# Patient Record
Sex: Male | Born: 1985 | Race: Black or African American | Hispanic: No | Marital: Single | Smoking: Current some day smoker
Health system: Southern US, Community
[De-identification: ages and names within clinical notes are randomized; demographics above are authoritative.]

## PROBLEM LIST (undated history)

## (undated) ENCOUNTER — Emergency Department (HOSPITAL_COMMUNITY): Admission: EM | Payer: Self-pay | Source: Home / Self Care

## (undated) DIAGNOSIS — D573 Sickle-cell trait: Secondary | ICD-10-CM

## (undated) DIAGNOSIS — N4839 Other priapism: Secondary | ICD-10-CM

## (undated) HISTORY — PX: ORIF FINGER / THUMB FRACTURE: SUR932

---
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
## 2007-10-12 ENCOUNTER — Ambulatory Visit: Payer: Self-pay | Admitting: Internal Medicine

## 2007-10-12 DIAGNOSIS — R519 Headache, unspecified: Secondary | ICD-10-CM | POA: Insufficient documentation

## 2007-10-12 DIAGNOSIS — R51 Headache: Secondary | ICD-10-CM | POA: Insufficient documentation

## 2007-10-12 DIAGNOSIS — B353 Tinea pedis: Secondary | ICD-10-CM | POA: Insufficient documentation

## 2007-12-19 ENCOUNTER — Encounter: Payer: Self-pay | Admitting: Internal Medicine

## 2008-08-08 ENCOUNTER — Ambulatory Visit: Payer: Self-pay | Admitting: Internal Medicine

## 2008-08-08 DIAGNOSIS — M545 Low back pain, unspecified: Secondary | ICD-10-CM | POA: Insufficient documentation

## 2008-08-23 ENCOUNTER — Encounter: Payer: Self-pay | Admitting: Internal Medicine

## 2008-11-27 ENCOUNTER — Ambulatory Visit: Payer: Self-pay | Admitting: Internal Medicine

## 2008-11-27 ENCOUNTER — Encounter: Admission: RE | Admit: 2008-11-27 | Discharge: 2008-11-27 | Payer: Self-pay | Admitting: Internal Medicine

## 2009-03-14 ENCOUNTER — Telehealth: Payer: Self-pay | Admitting: Internal Medicine

## 2009-10-10 ENCOUNTER — Telehealth: Payer: Self-pay | Admitting: Internal Medicine

## 2009-10-21 ENCOUNTER — Ambulatory Visit: Payer: Self-pay | Admitting: Internal Medicine

## 2009-10-21 DIAGNOSIS — R109 Unspecified abdominal pain: Secondary | ICD-10-CM | POA: Insufficient documentation

## 2009-10-21 DIAGNOSIS — Z87891 Personal history of nicotine dependence: Secondary | ICD-10-CM | POA: Insufficient documentation

## 2009-11-07 ENCOUNTER — Telehealth: Payer: Self-pay | Admitting: Internal Medicine

## 2009-11-15 ENCOUNTER — Ambulatory Visit: Payer: Self-pay | Admitting: Internal Medicine

## 2010-10-21 NOTE — Assessment & Plan Note (Signed)
Summary: PER LORRAINE/MOM GROIN AREA PROBLEM STC   Vital Signs:  Patient profile:   25 year old male Height:      66 inches Weight:      179 pounds Temp:     97.7 degrees F oral Pulse rate:   78 / minute BP sitting:   104 / 72  (left arm)  Vitals Entered By: Lamar Sprinkles, CMA (November 15, 2009 3:53 PM) CC: F/U from last ov.    CC:  F/U from last ov. .  History of Present Illness: C/o L testicle pain x 6 wks; flared up again after he sneezed the other day  Current Medications (verified): 1)  Vitamin D 1000 Unit Tabs (Cholecalciferol) .Marland Kitchen.. 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 Lungs are clear to auscultation, no crackles or wheezes. Heart:  Normal rate and regular rhythm. S1 and S2 normal without gallop, murmur, click, rub or other extra sounds. Abdomen:  Bowel sounds positive,abdomen soft and non-tender without masses, organomegaly or hernias noted. Genitalia:  Testes bilaterally descended without nodularity, tenderness or masses. No scrotal masses or lesions. No penis lesions or urethral discharge. NTno hydrocele, no varicocele, and no cutaneous lesions.  L epididimus is a little sensitive to palpation Skin:  Intact without suspicious lesions or rashes   Impression & Recommendations:  Problem # 1:  GROIN PAIN (ICD-789.09) L  Assessment Unchanged Empiric Zpac The following medications were removed from the medication list:    Ibuprofen 600 Mg  Tabs (Ibuprofen) .Marland Kitchen... 1 by mouth three times a day x 7 d then prn  Orders: Urology Referral (Urology) Dr Annabell Howells  Complete Medication List: 1)  Vitamin D 1000 Unit Tabs (Cholecalciferol) .Marland Kitchen.. 1 by mouth qd  Patient Instructions: 1)  Call if you are not better in a reasonable amount of time or if worse.  2)  Take the Zpac

## 2010-10-21 NOTE — Progress Notes (Signed)
Summary: Rx request  Phone Note Call from Patient Call back at Home Phone [phone]   Caller: Patient Call For: Tresa Garter MD Reason for Call: Refill Medication Summary of Call: Patient requests a prescription of Ketoconazole 2% cream for his feet. [facility] [address] Initial call taken by: Irma Newness,  October 10, 2009 9:17 AM  Follow-up for Phone Call        OK Follow-up by: Tresa Garter MD,  October 10, 2009 12:46 PM    New/Updated Medications: KETOCONAZOLE 2 % CREA (KETOCONAZOLE) use bid Prescriptions: KETOCONAZOLE 2 % CREA (KETOCONAZOLE) use bid  #90 g x 3   Entered by:   Josph Macho CMA   Authorized by:   Tresa Garter MD   Signed by:   Josph Macho CMA on 10/10/2009   Method used:   [facility] [address] [id number][facility] (retail)       [address]       [place], [place]  [address]       Ph: [phone] or [phone]       Fax: [phone]   RxID:   [id number]   Appended Document: Rx request Tried to call pt and VM is full  Appended Document: Rx request Pt informed

## 2010-10-21 NOTE — Assessment & Plan Note (Signed)
Summary: GROIN PAIN /NWS   Vital Signs:  Patient profile:   25 year old male Height:      67 inches Weight:      188 pounds BMI:     29.55 Temp:     98.6 degrees F oral Pulse rate:   61 / minute BP sitting:   112 / 80  (left arm)  Vitals Entered By: Tora Perches (October 21, 2009 4:04 PM) CC: groin pain Is Patient Diabetic? No   CC:  groin pain.  History of Present Illness: C/o dull pain in L groin  in L testicle  x 1.5 wks off and on. No injury worse w/activity; no swelling, pulling up, no hernia. Denies STD, d/c etc  Preventive Screening-Counseling & Management  Alcohol-Tobacco     Smoking Status: never  Caffeine-Diet-Exercise     Does Patient Exercise: yes  Current Medications (verified): 1)  Hydrocodone-Acetaminophen 5-325 Mg Tabs (Hydrocodone-Acetaminophen) .Marland Kitchen.. 1 By Mouth Up To 4 Times Per Day As Needed For Pain 2)  Ibuprofen 600 Mg  Tabs (Ibuprofen) .Marland Kitchen.. 1 By Mouth Three Times A Day X 4 D Then Prn 3)  Ketoconazole 2 % Crea (Ketoconazole) .... Use Bid  Allergies (verified): No Known Drug Allergies  Past History:  Past Medical History: Last updated: 08/08/2008  Headache  Family History: Reviewed history from 10/12/2007 and no changes required. 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    Physical Exam  General:  NAD Mouth:  Oral mucosa and oropharynx without lesions or exudates.  Teeth in good repair. Lungs:  Normal respiratory effort, chest expands symmetrically. Lungs are clear to auscultation, no crackles or wheezes. Heart:  Normal rate and regular rhythm. S1 and S2 normal without gallop, murmur, click, rub or other extra sounds. Abdomen:   Bowel sounds positive,abdomen soft and non-tender without masses, organomegaly or hernias noted. Genitalia:  Testes bilaterally descended without nodularity, tenderness or masses. No scrotal masses or lesions. No penis lesions or urethral discharge. NTno hydrocele, no varicocele, and no cutaneous lesions.  L epididimus is a little sensitive to palpation Msk:  No deformity or scoliosis noted of thoracic or lumbar spine.   Neurologic:  No cranial nerve deficits noted. Station and gait are normal. Plantar reflexes are down-going bilaterally. DTRs are symmetrical throughout. Sensory, motor and coordinative functions appear intact. Skin:  Intact without suspicious lesions or rashes Psych:  Cognition and judgment appear intact. Alert and cooperative with normal attention span and concentration. No apparent delusions, illusions, hallucinations   Impression & Recommendations:  Problem # 1:  GROIN PAIN (ICD-789.09) L - unclear etiol. Possible epididimitis Assessment New Empiric Cipro. Labs. [place] or Urol consult if needed The following medications were removed from the medication list:    Hydrocodone-acetaminophen 5-325 Mg Tabs (Hydrocodone-acetaminophen) .Marland Kitchen... 1 by mouth up to 4 times per day as needed for pain His updated medication list for this problem includes:    Ibuprofen 600 Mg Tabs (Ibuprofen) .Marland Kitchen... 1 by mouth three times a day x 7 d then prn  Complete Medication List: 1)  Ibuprofen 600 Mg Tabs (Ibuprofen) .Marland Kitchen.. 1 by mouth three times a day x 7 d then prn 2)  Ciprofloxacin Hcl 500 Mg Tabs (Ciprofloxacin hcl) .Marland Kitchen.. 1 by mouth bid 3)  Vitamin D 1000 Unit Tabs (Cholecalciferol) .Marland Kitchen.. 1 by mouth qd  Patient Instructions: 1)  Call if you are not better in a reasonable amount of time or if worse. Go to ER if feeling really bad! 2)  Use athletic underwear Prescriptions: CIPROFLOXACIN HCL 500 MG TABS (CIPROFLOXACIN HCL) 1 by mouth bid  #20 x 0   Entered and Authorized by:   Tresa Garter MD    Signed by:   Tresa Garter MD on 10/21/2009   Method used:   Print then Give to Patient   RxID:   [id number] IBUPROFEN 600 MG  TABS (IBUPROFEN) 1 by mouth three times a day x 7 d then prn  #60 x 1   Entered and Authorized by:   Tresa Garter MD   Signed by:   Tresa Garter MD on 10/21/2009   Method used:   Print then Give to Patient   RxID:   [id number]

## 2010-10-21 NOTE — Progress Notes (Signed)
Summary: REQ FOR RX  Phone Note Call from Patient   Summary of Call: Pt's mother, Karin Golden, called. Pt c/o "cold" and has "no voice", Req rx for antibiotic. left mess to call office back with more details Initial call taken by: Lamar Sprinkles, CMA,  November 07, 2009 9:23 AM  Follow-up for Phone Call        Spoke with pt's mother. Pt c/o chills, sinus & chest congestion w/green mucus. No fever. OTC meds have not helped. Patient is requesting rx for antibiotic.  Follow-up by: Lamar Sprinkles, CMA,  November 07, 2009 4:24 PM  Additional Follow-up for Phone Call Additional follow up Details #1::        OK Zpac Additional Follow-up by: Tresa Garter MD,  November 08, 2009 7:24 AM    Additional Follow-up for Phone Call Additional follow up Details #2::    Pt informed  Follow-up by: Lamar Sprinkles, CMA,  November 08, 2009 12:08 PM  New/Updated Medications: ZITHROMAX Z-PAK 250 MG TABS (AZITHROMYCIN) as dirrected Prescriptions: ZITHROMAX Z-PAK 250 MG TABS (AZITHROMYCIN) as dirrected  #1 x 0   Entered by:   Lamar Sprinkles, CMA   Authorized by:   Tresa Garter MD   Signed by:   Lamar Sprinkles, CMA on 11/08/2009   Method used:   [facility]        [facility] [address] [id number][facility] ([facility]ail[facility]       [address]       [place], [place]  [address]       Ph: [phone] or [phone]       Fax: [phone]   RxID:   [id number]

## 2012-02-23 ENCOUNTER — Ambulatory Visit (INDEPENDENT_AMBULATORY_CARE_PROVIDER_SITE_OTHER): Payer: Self-pay | Admitting: Internal Medicine

## 2012-02-23 ENCOUNTER — Encounter: Payer: Self-pay | Admitting: Internal Medicine

## 2012-02-23 ENCOUNTER — Other Ambulatory Visit (INDEPENDENT_AMBULATORY_CARE_PROVIDER_SITE_OTHER): Payer: Self-pay

## 2012-02-23 VITALS — BP 110/70 | HR 76 | Temp 97.7°F | Resp 20 | Wt 173.0 lb

## 2012-02-23 DIAGNOSIS — R5383 Other fatigue: Secondary | ICD-10-CM | POA: Insufficient documentation

## 2012-02-23 DIAGNOSIS — I889 Nonspecific lymphadenitis, unspecified: Secondary | ICD-10-CM

## 2012-02-23 DIAGNOSIS — J029 Acute pharyngitis, unspecified: Secondary | ICD-10-CM

## 2012-02-23 DIAGNOSIS — R5381 Other malaise: Secondary | ICD-10-CM

## 2012-02-23 LAB — BASIC METABOLIC PANEL
BUN: 11 mg/dL (ref 6–23)
CO2: 28 mEq/L (ref 19–32)
Chloride: 105 mEq/L (ref 96–112)
Creatinine, Ser: 1.1 mg/dL (ref 0.4–1.5)
Potassium: 4.3 mEq/L (ref 3.5–5.1)

## 2012-02-23 LAB — CBC WITH DIFFERENTIAL/PLATELET
Basophils Absolute: 0 10*3/uL (ref 0.0–0.1)
Eosinophils Absolute: 0.6 10*3/uL (ref 0.0–0.7)
Lymphocytes Relative: 55.1 % — ABNORMAL HIGH (ref 12.0–46.0)
MCHC: 33 g/dL (ref 30.0–36.0)
Neutrophils Relative %: 17.9 % — ABNORMAL LOW (ref 43.0–77.0)
Platelets: 221 10*3/uL (ref 150.0–400.0)
RDW: 12.9 % (ref 11.5–14.6)

## 2012-02-23 LAB — HEPATIC FUNCTION PANEL
Alkaline Phosphatase: 87 U/L (ref 39–117)
Bilirubin, Direct: 0.2 mg/dL (ref 0.0–0.3)

## 2012-02-23 MED ORDER — IBUPROFEN 600 MG PO TABS: ORAL_TABLET | ORAL | Status: AC

## 2012-02-23 MED ORDER — AZITHROMYCIN 250 MG PO TABS: ORAL_TABLET | ORAL | Status: DC

## 2012-02-23 NOTE — Progress Notes (Signed)
  Subjective:    Patient ID: Gabriel Thomas, male    DOB: April 15, 1986, 26 y.o.   MRN: [id number]  HPI  C/o URI 1 mo ago C/o feeling tired, c/o swelling of the glands on the neck. No wt loss  Review of Systems  Constitutional: Positive for fatigue. Negative for fever, chills, diaphoresis, appetite change and unexpected weight change.  HENT: Negative for nosebleeds, congestion, sore throat, rhinorrhea, sneezing, mouth sores, trouble swallowing, neck pain, neck stiffness, dental problem and postnasal drip.   Eyes: Negative for discharge, redness, itching and visual disturbance.  Respiratory: Negative for cough, chest tightness, shortness of breath and wheezing.   Cardiovascular: Negative for chest pain, palpitations and leg swelling.  Gastrointestinal: Negative for nausea, diarrhea, blood in stool and abdominal distention.  Genitourinary: Negative for dysuria, frequency, hematuria and genital sores.  Musculoskeletal: Negative for back pain, joint swelling and gait problem.  Skin: Negative for rash.  Neurological: Negative for dizziness, tremors, speech difficulty and weakness.  Psychiatric/Behavioral: Negative for suicidal ideas, sleep disturbance, dysphoric mood and agitation. The patient is not nervous/anxious.        Objective:   Physical Exam  Constitutional: He is oriented to person, place, and time. He appears well-developed.  HENT:  Mouth/Throat: Oropharynx is clear and moist.  Eyes: Conjunctivae are normal. Pupils are equal, round, and reactive to light.  Neck: Normal range of motion. No JVD present. No thyromegaly present.       Anter/post cerv and occipital adenopathy, NT  Cardiovascular: Normal rate, regular rhythm, normal heart sounds and intact distal pulses.  Exam reveals no gallop and no friction rub.   No murmur heard. Pulmonary/Chest: Effort normal and breath sounds normal. No respiratory distress. He has no wheezes. He has no rales. He exhibits no tenderness.  Abdominal:  Soft. Bowel sounds are normal. He exhibits no distension and no mass. There is no tenderness. There is no rebound and no guarding.       No HSM  Musculoskeletal: Normal range of motion. He exhibits no edema and no tenderness.  Lymphadenopathy:    He has cervical adenopathy.  Neurological: He is alert and oriented to person, place, and time. He has normal reflexes. No cranial nerve deficit. He exhibits normal muscle tone. Coordination normal.  Skin: Skin is warm and dry. No rash noted.  Psychiatric: He has a normal mood and affect. His behavior is normal. Judgment and thought content normal.    Lab Results  Component Value Date   WBC 5.5 02/23/2012   HGB 14.1 02/23/2012   HCT 42.8 02/23/2012   PLT 221.0 02/23/2012   GLUCOSE 87 02/23/2012   ALT 27 02/23/2012   AST 26 02/23/2012   NA 142 02/23/2012   K 4.3 02/23/2012   CL 105 02/23/2012   CREATININE 1.1 02/23/2012   BUN 11 02/23/2012   CO2 28 02/23/2012         Assessment & Plan:

## 2012-02-24 ENCOUNTER — Telehealth: Payer: Self-pay | Admitting: Internal Medicine

## 2012-02-24 ENCOUNTER — Ambulatory Visit (INDEPENDENT_AMBULATORY_CARE_PROVIDER_SITE_OTHER)
Admission: RE | Admit: 2012-02-24 | Discharge: 2012-02-24 | Disposition: A | Discharge status: Home / Self Care | Payer: Self-pay | Source: Ambulatory Visit | Attending: Internal Medicine | Admitting: Internal Medicine

## 2012-02-24 DIAGNOSIS — I889 Nonspecific lymphadenitis, unspecified: Secondary | ICD-10-CM

## 2012-02-24 DIAGNOSIS — R5381 Other malaise: Secondary | ICD-10-CM

## 2012-02-24 LAB — HIV ANTIBODY (ROUTINE TESTING W REFLEX): HIV: NONREACTIVE

## 2012-02-24 NOTE — Telephone Encounter (Signed)
Pt informed

## 2012-02-24 NOTE — Assessment & Plan Note (Signed)
Zpac 

## 2012-02-24 NOTE — Assessment & Plan Note (Addendum)
Labs, CXR Zpac, Ibuprofen Call if not better. Consider Prednisone, LN bx RTC 4 wks

## 2012-02-24 NOTE — Telephone Encounter (Signed)
Gabriel Thomas, please, inform patient that all labs are normal - blood count is c/w a viral infection. Rx as we planned. Thx

## 2013-06-19 ENCOUNTER — Ambulatory Visit (INDEPENDENT_AMBULATORY_CARE_PROVIDER_SITE_OTHER): Payer: Self-pay | Admitting: *Deleted

## 2013-06-19 DIAGNOSIS — Z23 Encounter for immunization: Secondary | ICD-10-CM

## 2013-06-26 ENCOUNTER — Encounter (HOSPITAL_BASED_OUTPATIENT_CLINIC_OR_DEPARTMENT_OTHER): Payer: Self-pay

## 2013-06-26 ENCOUNTER — Emergency Department (HOSPITAL_BASED_OUTPATIENT_CLINIC_OR_DEPARTMENT_OTHER)
Admission: EM | Admit: 2013-06-26 | Discharge: 2013-06-26 | Disposition: A | Discharge status: Home / Self Care | Payer: Self-pay | Attending: Emergency Medicine | Admitting: Emergency Medicine

## 2013-06-26 DIAGNOSIS — Z87891 Personal history of nicotine dependence: Secondary | ICD-10-CM | POA: Insufficient documentation

## 2013-06-26 DIAGNOSIS — Y9389 Activity, other specified: Secondary | ICD-10-CM | POA: Insufficient documentation

## 2013-06-26 DIAGNOSIS — S61211A Laceration without foreign body of left index finger without damage to nail, initial encounter: Secondary | ICD-10-CM

## 2013-06-26 DIAGNOSIS — W268XXA Contact with other sharp object(s), not elsewhere classified, initial encounter: Secondary | ICD-10-CM | POA: Insufficient documentation

## 2013-06-26 DIAGNOSIS — S61209A Unspecified open wound of unspecified finger without damage to nail, initial encounter: Secondary | ICD-10-CM | POA: Insufficient documentation

## 2013-06-26 DIAGNOSIS — Z23 Encounter for immunization: Secondary | ICD-10-CM | POA: Insufficient documentation

## 2013-06-26 DIAGNOSIS — Y929 Unspecified place or not applicable: Secondary | ICD-10-CM | POA: Insufficient documentation

## 2013-06-26 MED ORDER — TETANUS-DIPHTH-ACELL PERTUSSIS 5-2.5-18.5 LF-MCG/0.5 IM SUSP
0.5000 mL | Freq: Once | INTRAMUSCULAR | Status: AC
  Administered 2013-06-26: 0.5 mL via INTRAMUSCULAR
  Filled 2013-06-26: qty 0.5

## 2013-06-26 NOTE — ED Notes (Signed)
Pt was trimming his hair and accidentally cut his second digit finger on left hand.  Approximately half inch, bleeding controlled. Pt put super glue over the cut in attempt to stop the bleeding.

## 2013-06-26 NOTE — ED Provider Notes (Signed)
CSN: [id number]     Arrival date & time 06/26/13  1649 History  This chart was scribed for Charles B. Bernette Mayers, MD by Greggory Stallion, ED Scribe. This patient was seen in room MH02/MH02 and the patient's care was started at 4:55 PM.   No chief complaint on file.  The history is provided by the patient. No language interpreter was used.    HPI Comments: Gabriel Thomas is a 27 y.o. male who presents to the Emergency Department complaining of left index finger injury that occurred a few hours ago. He states he was trimming his hair and accidentally cut his finger. Pt has sudden onset, mild finger pain. He states he put superglue over the cut in attempt to stop the bleeding and put gauze over it. Pt states his last tetanus was over 5 years ago.  History reviewed. No pertinent past medical history. History reviewed. No pertinent past surgical history. Family History  Problem Relation Age of Onset  . Arthritis Mother   . Diabetes Mother   . Hypertension Mother    History  Substance Use Topics  . Smoking status: Former Games developer  . Smokeless tobacco: Not on file  . Alcohol Use: No    Review of Systems A complete 10 system review of systems was obtained and all systems are negative except as noted in the HPI and PMH.   Allergies  Review of patient's allergies indicates no known allergies.  Home Medications   Current Outpatient Rx  Name  Route  Sig  Dispense  Refill  . EXPIRED: azithromycin (ZITHROMAX Z-PAK) 250 MG tablet      As dirrected   6 each   0    BP 118/68  Pulse 61  Temp(Src) 98.4 F (36.9 C) (Oral)  Resp 18  SpO2 99%  Physical Exam  Constitutional: He is oriented to person, place, and time. He appears well-developed and well-nourished.  HENT:  Head: Normocephalic and atraumatic.  Neck: Neck supple.  Pulmonary/Chest: Effort normal.  Musculoskeletal:  1.5 cm laceration to the left index finger over the radial PIP joint dressed with superglue and gauze at home.    Neurological: He is alert and oriented to person, place, and time. No cranial nerve deficit.  Psychiatric: He has a normal mood and affect. His behavior is normal.    ED Course  Procedures (including critical care time)  DIAGNOSTIC STUDIES: Oxygen Saturation is 99% on RA, normal by my interpretation.    COORDINATION OF CARE: 4:59 PM-Discussed treatment plan which includes updating tetanus and getting glue off the wound and cleaning it with pt at bedside and pt agreed to plan.   Labs Review Labs Reviewed - No data to display Imaging Review No results found.  MDM   1. Laceration of left index finger w/o foreign body w/o damage to nail, initial encounter     Pt concerned because he accidentally got gauze stuck in the superglue he used to dress his wound at home. A small amount of bacitracin was applied to loosed the glue and the gauze was removed. Wound is closed and intact at this point will not attempt to fully remove the glue. PT states wound was completely closed before he applied the gauze. Pt comfortable with this plan, informed of possibility of retained FB and he understands this is at risk of infection. Advised to return immediately for any swelling, redness, pain or drainage. TDAP updated.      I personally performed the services described in this documentation, which  was scribed in my presence. The recorded information has been reviewed and is accurate.     Charles B. Bernette Mayers, MD 06/26/13 1806

## 2013-09-18 ENCOUNTER — Encounter: Payer: Self-pay | Admitting: *Deleted

## 2013-10-30 ENCOUNTER — Other Ambulatory Visit: Payer: Self-pay | Admitting: *Deleted

## 2013-10-30 NOTE — Telephone Encounter (Signed)
Ok Ketoconazole cream Thx

## 2013-10-30 NOTE — Telephone Encounter (Signed)
Per mother- pt is requesting fungal foot cream. Please advise.

## 2013-10-31 ENCOUNTER — Telehealth: Payer: Self-pay | Admitting: *Deleted

## 2013-10-31 MED ORDER — KETOCONAZOLE 2 % EX CREA: 1.0000 "application " | TOPICAL_CREAM | Freq: Two times a day (BID) | CUTANEOUS | Status: DC

## 2013-10-31 NOTE — Telephone Encounter (Signed)
Left detailed mess informing pt I need to know what pharmacy he uses so I can send ketoconazole cream Rx there. I have printed Rx on my desk.

## 2013-11-03 NOTE — Telephone Encounter (Signed)
Unable to contact pt- [facility] [place] [place]

## 2013-12-07 IMAGING — CR DG CHEST 2V
2 series · 2 of 2 positions shown · non-contrast
Comparison: None

CLINICAL DATA: Cervical adenopathy/lymphadenitis for 1 month

CHEST - 2 VIEW

[view not recorded (1 of 2)]
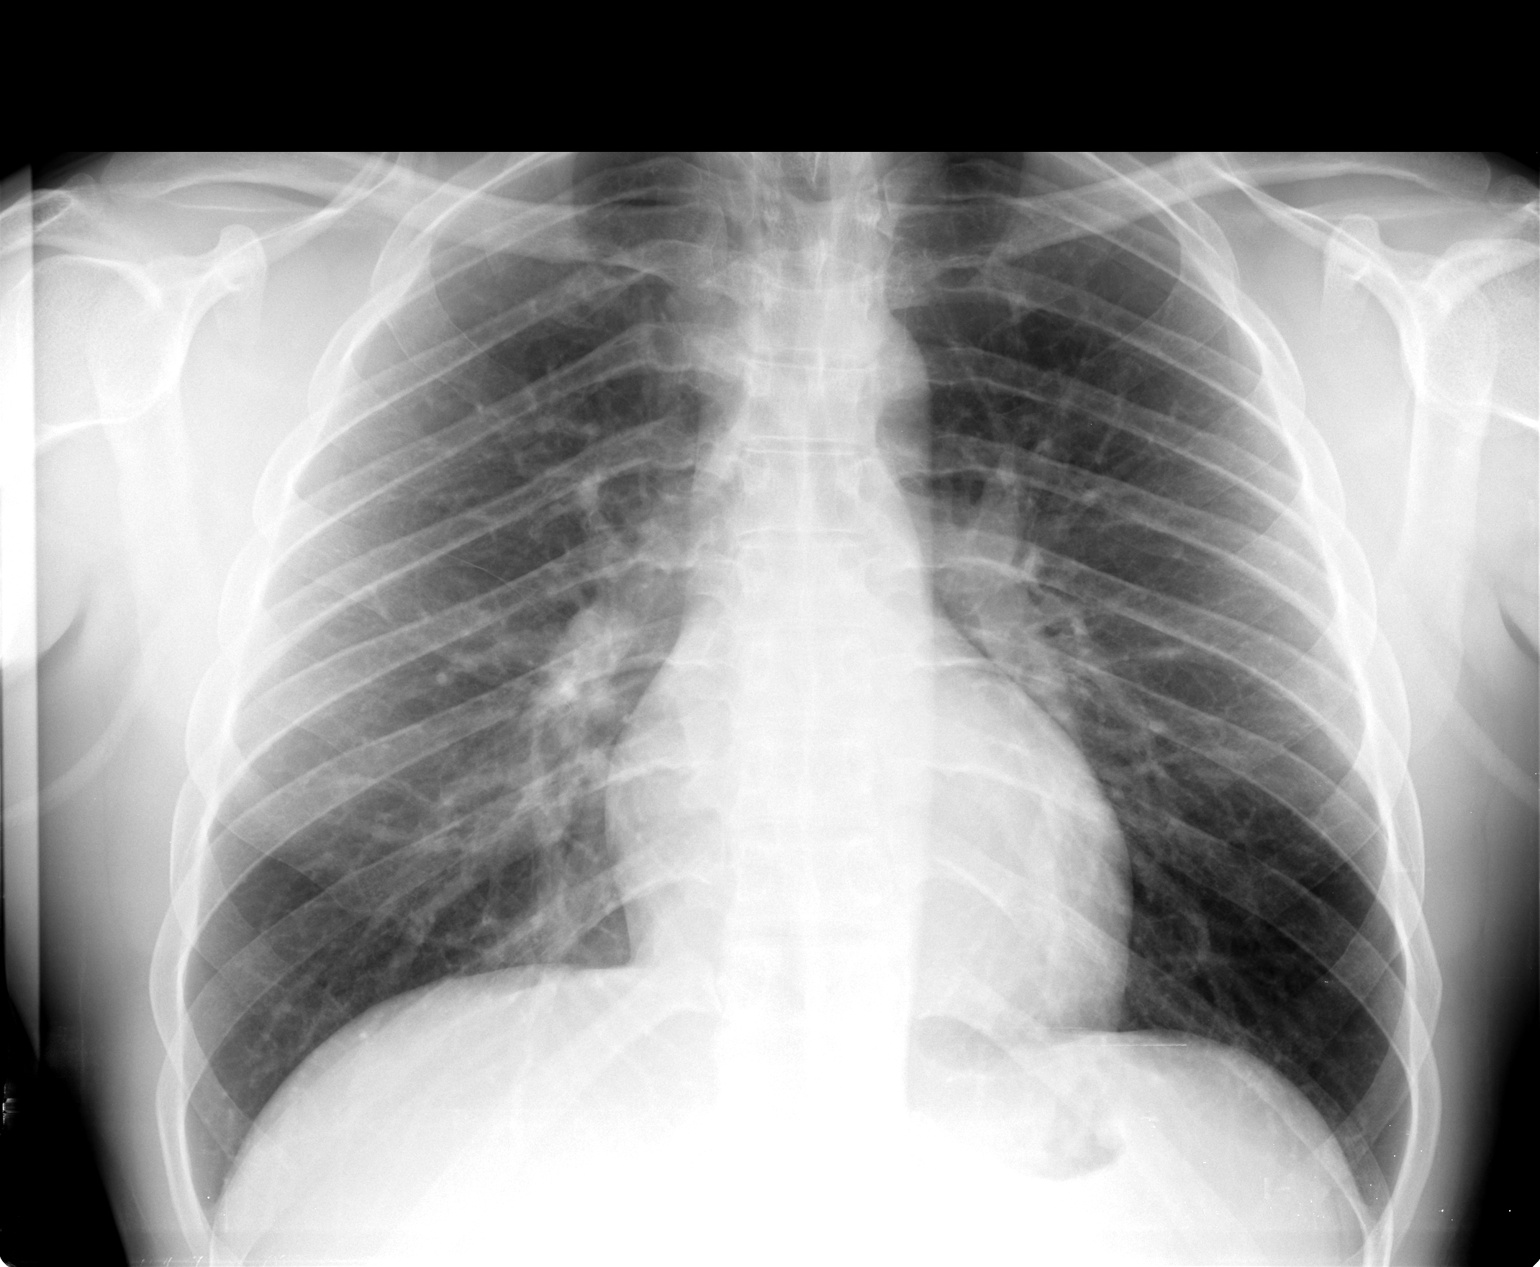

[view not recorded (2 of 2)]
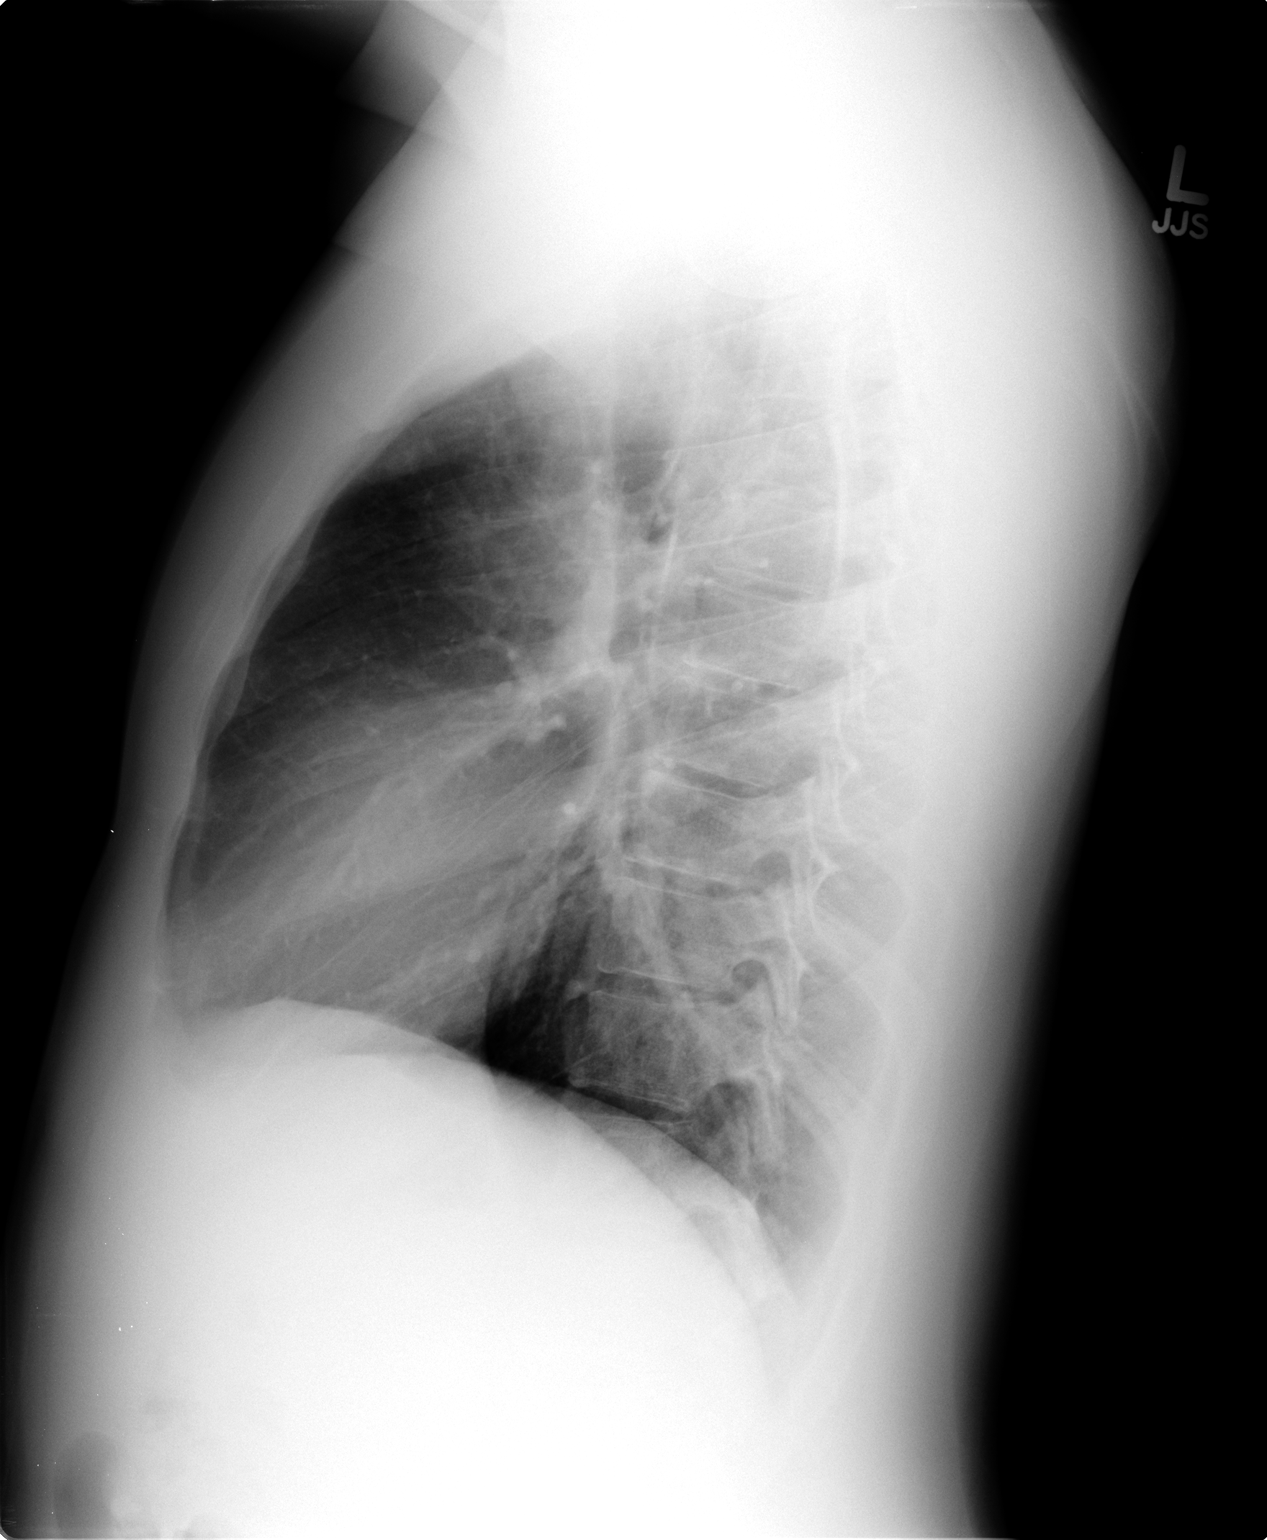

[2 of 2 positions shown; findings below may reference images not displayed]

FINDINGS: Normal heart size, mediastinal contours, and pulmonary vascularity.
Lungs clear.
No pleural effusion or pneumothorax.
Bones unremarkable.
IMPRESSION: Normal exam.

## 2014-06-09 ENCOUNTER — Encounter (HOSPITAL_BASED_OUTPATIENT_CLINIC_OR_DEPARTMENT_OTHER): Payer: Self-pay | Admitting: Emergency Medicine

## 2014-06-09 ENCOUNTER — Emergency Department (HOSPITAL_BASED_OUTPATIENT_CLINIC_OR_DEPARTMENT_OTHER)
Admission: EM | Admit: 2014-06-09 | Discharge: 2014-06-09 | Disposition: A | Discharge status: Home / Self Care | Payer: Self-pay | Attending: Emergency Medicine | Admitting: Emergency Medicine

## 2014-06-09 DIAGNOSIS — N4830 Priapism, unspecified: Secondary | ICD-10-CM | POA: Insufficient documentation

## 2014-06-09 DIAGNOSIS — R300 Dysuria: Secondary | ICD-10-CM | POA: Insufficient documentation

## 2014-06-09 DIAGNOSIS — Z87440 Personal history of urinary (tract) infections: Secondary | ICD-10-CM | POA: Insufficient documentation

## 2014-06-09 DIAGNOSIS — Z79899 Other long term (current) drug therapy: Secondary | ICD-10-CM | POA: Insufficient documentation

## 2014-06-09 LAB — URINALYSIS, ROUTINE W REFLEX MICROSCOPIC
BILIRUBIN URINE: NEGATIVE
Glucose, UA: NEGATIVE mg/dL
Hgb urine dipstick: NEGATIVE
Ketones, ur: NEGATIVE mg/dL
LEUKOCYTES UA: NEGATIVE
NITRITE: NEGATIVE
PH: 7 (ref 5.0–8.0)
Protein, ur: NEGATIVE mg/dL
Specific Gravity, Urine: 1.017 (ref 1.005–1.030)
UROBILINOGEN UA: 0.2 mg/dL (ref 0.0–1.0)

## 2014-06-09 MED ORDER — SODIUM CHLORIDE 0.9 % IV BOLUS (SEPSIS)
1000.0000 mL | Freq: Once | INTRAVENOUS | Status: AC
  Administered 2014-06-09: 1000 mL via INTRAVENOUS

## 2014-06-09 MED ORDER — HYDROMORPHONE HCL 1 MG/ML IJ SOLN
2.0000 mg | Freq: Once | INTRAMUSCULAR | Status: AC
  Administered 2014-06-09: 2 mg via INTRAVENOUS
  Filled 2014-06-09: qty 2

## 2014-06-09 MED ORDER — TERBUTALINE SULFATE 1 MG/ML IJ SOLN
0.2500 mg | Freq: Once | INTRAMUSCULAR | Status: DC
  Filled 2014-06-09: qty 1

## 2014-06-09 MED ORDER — HYDROMORPHONE HCL 1 MG/ML IJ SOLN
1.0000 mg | Freq: Once | INTRAMUSCULAR | Status: AC
  Administered 2014-06-09: 1 mg via INTRAVENOUS
  Filled 2014-06-09: qty 1

## 2014-06-09 MED ORDER — PSEUDOEPHEDRINE HCL 30 MG PO TABS
120.0000 mg | ORAL_TABLET | Freq: Once | ORAL | Status: AC
Start: 2014-06-09 — End: 2014-06-09
  Administered 2014-06-09: 120 mg via ORAL
  Filled 2014-06-09: qty 4

## 2014-06-09 NOTE — ED Notes (Signed)
PT called mother to  drive him home when he is ready for discharge

## 2014-06-09 NOTE — ED Notes (Signed)
Pt was diagnosed with UTI and completed antibiotics at the end of august.  Pt woke up this am not feeling well with pain in tip of penis.  No fever or chills.  No pain or burning with urination.  No flank pain, no discharge from penis.

## 2014-06-09 NOTE — Discharge Instructions (Signed)
Priapism °Priapism is a persistent, often painful erection. It is the hardening of the penis in males and of the clitoris in females, even without sexual stimulation. Priapism may come on suddenly. Priapism may last a short while, or may last a long time. Priapism occurs in all ages. The types of priapism include: °· Acute prolonged priapism--Priapism that comes on suddenly and lasts. °· Recurrent acute priapism--Priapism that comes on suddenly and tends to happen again. °· Chronic priapism--Priapism that is persistent but with less of an erection. °CAUSES  °There are many causes. Causes include: °· Blood problems common in people with the following diseases: °¨ Sickle cell disease. °¨ Leukemia. °· Side effects of erectile dysfunction medicine. This is the most common cause of priapism. °· Side effects of prescription medicine used in the treatment of depression and anxiety. °· Illegal use of street drugs such as cocaine and marijuana. °· Excessive use of alcohol. °· Neurological problems such as multiple sclerosis. °· Diabetes mellitus. °· The cause may be unknown. °SIGNS AND SYMPTOMS °· A prolonged erection, usually without sexual stimulation or following the use of erectile dysfunction medicine. °· A painful erection. °DIAGNOSIS °Diagnosis of priapism can usually be confirmed by your health care provider after a physical exam. Your health care provider may have blood tests done to search for a potential cause, such as leukemia or sickle cell disease. °TREATMENT  °Treatments depend on the cause. Some specific treatments include: °· Oxygen and red blood cell transfusions in patients with sickle cell disease. °· A special treatment for plasma in those with leukemia. °· Removing blood that is trapped. °· Treatment with medicine. °· Surgical shunting (a passage that is made to allow blood to flow from one part of the body to another). °HOME CARE INFORMATION °· Avoid sexual stimulation and intercourse until your health  care provider says it is okay. °· Avoid the use of alcohol or drugs to minimize recurrence of priapism. °SEEK MEDICAL CARE IF: °· You experience worsening pain instead of improvement. °SEEK IMMEDIATE MEDICAL CARE IF: °· You experience fever or shaking chills. °· You experience pain, swelling, or redness in your genital or groin area. °MAKE SURE YOU: °· Understand these instructions.   °· Will watch your condition. °· Will get help right away if you are not doing well or get worse. °Document Released: 11/28/2003 Document Revised: 06/28/2013 Document Reviewed: 02/09/2013 °[employer]® Patient Information ©2015 [employer]. This information is not intended to replace advice given to you by your health care provider. Make sure you discuss any questions you have with your health care provider. ° °

## 2014-06-09 NOTE — ED Notes (Signed)
Ice pack applied to groin area

## 2014-06-09 NOTE — ED Provider Notes (Signed)
Medical screening examination/treatment/procedure(s) were conducted as a shared visit with non-physician practitioner(s) and myself.  I personally evaluated the patient during the encounter.   EKG Interpretation None      Pt presenting w/ priapism. No clear cause. No scrotal pain, swelling. No infectious s/sx recently. Penis erect, painful. He is hesitant to try intracorporal injection. Will do trial of IVF, IV pain meds. And reexamine.   Toy Cookey, MD 06/09/14 [phone]

## 2014-06-09 NOTE — ED Notes (Signed)
PT discharged to home with mother. NAD.  

## 2014-06-09 NOTE — ED Provider Notes (Signed)
CSN: [id number]     Arrival date & time 06/09/14  1342 History   First MD Initiated Contact with Patient 06/09/14 1449     Chief Complaint  Patient presents with  . Urinary Tract Infection     (Consider location/radiation/quality/duration/timing/severity/associated sxs/prior Treatment) Patient is a 28 y.o. male presenting with dysuria. The history is provided by the patient. No language interpreter was used.  Dysuria This is a new problem. The current episode started yesterday. Pertinent negatives include no abdominal pain, chills, fever, myalgias, nausea, rash or vomiting. Associated symptoms comments: He is here for evaluation of urethral pain he states feels like it is in the distal end of the penis. No urinary pain, frequency, hematuria. No fever. He was treated for UTI several weeks ago and completed the antibiotics that were given. He denies having UTI symptoms at that time and that it was an incidental finding. He reports that he woke this morning with an erection that has persisted all day today. He has no history of same. No abdominal pain, penile discharge. He is able to urinate today without obstructive symptoms. .    History reviewed. No pertinent past medical history. Past Surgical History  Procedure Laterality Date  . Orif finger / thumb fracture     Family History  Problem Relation Age of Onset  . Arthritis Mother   . Diabetes Mother   . Hypertension Mother    History  Substance Use Topics  . Smoking status: Former Games developer  . Smokeless tobacco: Not on file  . Alcohol Use: Yes    Review of Systems  Constitutional: Negative for fever and chills.  Gastrointestinal: Negative.  Negative for nausea, vomiting and abdominal pain.  Genitourinary: Positive for penile pain. Negative for dysuria, frequency, flank pain, discharge, scrotal swelling and testicular pain.       See HPI.  Musculoskeletal: Negative.  Negative for back pain and myalgias.  Skin: Negative.  Negative  for rash and wound.  Neurological: Negative.       Allergies  Review of patient's allergies indicates no known allergies.  Home Medications   Prior to Admission medications   Medication Sig Start Date End Date Taking? Authorizing Provider  azithromycin (ZITHROMAX Z-PAK) 250 MG tablet As dirrected 02/23/12 02/28/12  Aleksei Plotnikov V, MD  ketoconazole (NIZORAL) 2 % cream Apply 1 application topically 2 (two) times daily. 10/30/13   Aleksei Plotnikov V, MD   BP 127/66  Pulse 60  Temp(Src) 98.1 F (36.7 C)  Resp 16  Ht  (1.702 m)  Wt 175 lb (79.379 kg)  BMI 27.40 kg/m2  SpO2 100% Physical Exam  Constitutional: He is oriented to person, place, and time. He appears well-developed and well-nourished.  Neck: Normal range of motion.  Pulmonary/Chest: Effort normal.  Genitourinary:  Circumcised penis with erection. No urethral discharge. No scrotal swelling or testicular tenderness.   Musculoskeletal: Normal range of motion.  Lymphadenopathy:       Right: No inguinal adenopathy present.       Left: No inguinal adenopathy present.  Neurological: He is alert and oriented to person, place, and time.  Skin: Skin is warm and dry.  Psychiatric: He has a normal mood and affect.    ED Course  Procedures (including critical care time) Labs Review Labs Reviewed  GC/CHLAMYDIA PROBE AMP  URINALYSIS, ROUTINE W REFLEX MICROSCOPIC    Imaging Review No results found.   EKG Interpretation None      MDM   Final diagnoses:  None  1. Priapism  Discussed with the patient that he erection would need to be treated in the ED to reduce his risk of long term injury. He agreed to stay for oral and/or IV medications but refuses penile injection.  Pain medication and decongestants given without significant relief. He appears comfortable otherwise. UA negative. Cultures pending.   Ice pack applied, Terbutaline given IV - no PO available. Recheck shows improvement in priapism -  significantly reduced. No cause identified. Will refer to urology for further evaluation.    Arnoldo Hooker, PA-C 06/09/14 2016

## 2014-06-09 NOTE — ED Provider Notes (Signed)
6:59 PM Pt refusing phenylephrine injection, would like to wait longer. I have expressed my concern that the longer we wait, the more potential damage is occuring to his penis.  Will give another dose of pain meds,  IVF, place ice groin,  and will given him 20 mins.   7:20 PM Tumescence as much improved, pain is improved as well. We'll continue to monitor, he is clinically improving will not do intracorporeal injection at this point.   Pt continues to improve and will be d/c'd home with referral given to f/u as outpt with urology.   1. Priapism      Toy Cookey, MD 06/09/14 [phone]

## 2014-06-11 LAB — GC/CHLAMYDIA PROBE AMP
CT Probe RNA: NEGATIVE
GC Probe RNA: NEGATIVE

## 2015-05-10 ENCOUNTER — Telehealth: Payer: Self-pay | Admitting: Internal Medicine

## 2015-05-10 NOTE — Telephone Encounter (Signed)
pts mother, Trixie Deis, called regarding getting him in to see you regarding possible depression. It has been over 3 years since you've last seen him May we go ahead and re establish him? Please advise

## 2015-05-10 NOTE — Telephone Encounter (Signed)
Ok Thx 

## 2015-05-13 NOTE — Telephone Encounter (Signed)
Pt informed- will call back to schedule appt.

## 2016-01-26 ENCOUNTER — Emergency Department (HOSPITAL_BASED_OUTPATIENT_CLINIC_OR_DEPARTMENT_OTHER)
Admission: EM | Admit: 2016-01-26 | Discharge: 2016-01-26 | Disposition: A | Discharge status: Home / Self Care | Payer: Self-pay | Attending: Emergency Medicine | Admitting: Emergency Medicine

## 2016-01-26 ENCOUNTER — Encounter (HOSPITAL_BASED_OUTPATIENT_CLINIC_OR_DEPARTMENT_OTHER): Payer: Self-pay

## 2016-01-26 DIAGNOSIS — Z5189 Encounter for other specified aftercare: Secondary | ICD-10-CM

## 2016-01-26 DIAGNOSIS — N4889 Other specified disorders of penis: Secondary | ICD-10-CM | POA: Insufficient documentation

## 2016-01-26 DIAGNOSIS — Z87891 Personal history of nicotine dependence: Secondary | ICD-10-CM | POA: Insufficient documentation

## 2016-01-26 DIAGNOSIS — Z4800 Encounter for change or removal of nonsurgical wound dressing: Secondary | ICD-10-CM | POA: Insufficient documentation

## 2016-01-26 MED ORDER — HYDROCODONE-ACETAMINOPHEN 5-325 MG PO TABS: 1.0000 | ORAL_TABLET | ORAL | Status: DC | PRN

## 2016-01-26 NOTE — ED Provider Notes (Signed)
CSN: [id number]     Arrival date & time 01/26/16  1241 History   First MD Initiated Contact with Patient 01/26/16 1332     Chief Complaint  Patient presents with  . Suture / Staple Removal     (Consider location/radiation/quality/duration/timing/severity/associated sxs/prior Treatment) HPI Comments: 30 year old male presents for wound check. The patient reports last week he was seen in [place] for priapism. Urology did a surgical intervention making 2 lacerations into the head of his penis. He reports that it has been healing well although it has been tender. He reports today he noted one of the sutures had popped off on the right side and he had some mild bleeding. He is concerned that he will now get an infection that will affect him and is afraid that he could lose his penis.   History reviewed. No pertinent past medical history. Past Surgical History  Procedure Laterality Date  . Orif finger / thumb fracture     Family History  Problem Relation Age of Onset  . Arthritis Mother   . Diabetes Mother   . Hypertension Mother    Social History  Substance Use Topics  . Smoking status: Former Games developer  . Smokeless tobacco: None  . Alcohol Use: Yes    Review of Systems  Constitutional: Negative for fever and chills.  HENT: Negative for congestion and rhinorrhea.   Gastrointestinal: Negative for nausea and abdominal pain.  Genitourinary: Positive for penile pain. Negative for dysuria, frequency, decreased urine volume, discharge, penile swelling, scrotal swelling, difficulty urinating and testicular pain.  Skin: Positive for wound.      Allergies  Review of patient's allergies indicates no known allergies.  Home Medications   Prior to Admission medications   Medication Sig Start Date End Date Taking? Authorizing Provider  azithromycin (ZITHROMAX Z-PAK) 250 MG tablet As dirrected 02/23/12 02/28/12  Aleksei Plotnikov V, MD  ketoconazole (NIZORAL) 2 % cream Apply 1 application  topically 2 (two) times daily. 10/30/13   Aleksei Plotnikov V, MD   BP 127/81 mmHg  Pulse 63  Temp(Src) 98.5 F (36.9 C) (Oral)  Resp 18  Ht  (1.702 m)  Wt 172 lb (78.019 kg)  BMI 26.93 kg/m2  SpO2 100% Physical Exam  Constitutional: He is oriented to person, place, and time. He appears well-developed and well-nourished. No distress.  HENT:  Head: Normocephalic and atraumatic.  Right Ear: External ear normal.  Left Ear: External ear normal.  Mouth/Throat: Oropharynx is clear and moist. No oropharyngeal exudate.  Eyes: EOM are normal. Pupils are equal, round, and reactive to light.  Neck: Normal range of motion. Neck supple.  Cardiovascular: Normal rate, regular rhythm, normal heart sounds and intact distal pulses.   No murmur heard. Pulmonary/Chest: Effort normal. No respiratory distress. He has no wheezes. He has no rales.  Abdominal: Soft. He exhibits no distension. There is no tenderness.  Genitourinary: Testes normal.    No penile erythema. No discharge found.  Musculoskeletal: He exhibits no edema.  Neurological: He is alert and oriented to person, place, and time.  Skin: Skin is warm and dry. No rash noted. He is not diaphoretic.  Vitals reviewed.   ED Course  Procedures (including critical care time) Labs Review Labs Reviewed - No data to display  Imaging Review No results found. I have personally reviewed and evaluated these images and lab results as part of my medical decision-making.   EKG Interpretation None      MDM  Patient was seen and evaluated  in stable condition. Penis with 2 small incisions that are well healing. No sign of infection. No active bleeding. [facility]. He said this is very normal. They usually expect bleeding postoperatively. He said the patient could follow-up in the office and even did not need to be seen for about 3 weeks but could be seen sooner if he wanted the wound  reevaluated. He did recommend the patient to keep some tissue in his underwear as more bleeding was likely. This was discussed at length with patient at the bedside. He expressed understanding and agreement with plan for discharge. Patient was discharged home in stable condition. Final diagnoses:  Visit for wound check    1. Wound check    Leta BaptistEmily Roe Derick Seminara, MD 01/26/16 1515

## 2016-01-26 NOTE — ED Notes (Signed)
Has no difficulty with urination. Denies any fevers or drainage from suture sites

## 2016-01-26 NOTE — ED Notes (Signed)
Presents with penial pain, states he rolled over and began having pain, noted dsg had some blood on it.

## 2016-01-26 NOTE — Discharge Instructions (Signed)
You were seen and evaluated today for the incision sites from your priapism procedure. This appears to be healing well. I discussed this with the urologist who says that usually these will have bleeding afterwards and is not abnormal. They said that is actually a good think of that helps the priapism heal. They said that you can follow-up with them in 3-4 weeks but if you are concerned and would like a sooner visit you can call the office to make a sooner appointment. They recommended keeping some tissue in your underwear to help with bleeding as this is expected.   Wound Check If you have a wound, it may take some time to heal. Eventually, a scar will form. The scar will also fade with time. It is important to take care of your wound while it is healing. This helps to protect your wound from infection.  HOW SHOULD I TAKE CARE OF MY WOUND AT HOME?  Some wounds are allowed to close on their own or are repaired at a later date. There are many different ways to close and cover a wound, including stitches (sutures), skin glue, and adhesive strips. Follow your health care provider's instructions about:  Wound care.  Bandage (dressing) changes and removal.  Wound closure removal.  Take medicines only as directed by your health care provider.  Keep all follow-up visits as directed by your health care provider. This is important.  Do not take baths, swim, or use a hot tub until your health care provider approves. You may shower as directed by your health care provider.  Keep your wound clean and dry. WHAT AFFECTS SCAR FORMATION? Scars affect each person differently. How your body scars depends on:  The location and size of your wound.  Traits that you inherited from your parents (genetic predisposition).  How you take care of your wound. Irritation and inflammation increase the amount of scar formation.  Sun exposure. This can darken a scar. WHEN SHOULD I CALL OR SEE MY HEALTH CARE  PROVIDER? Call or see your health care provider if:  You have redness, swelling, or pain at your wound site.  You have fluid, blood, or pus coming from your wound.  You have muscle aches, chills, or a general ill feeling.  You notice a bad smell coming from the wound.  Your wound separates after the sutures, staples, or skin adhesive strips have been removed.  You have persistent nausea or vomiting.  You have a fever.  You are dizzy. WHEN SHOULD I CALL 911 OR GO TO THE EMERGENCY ROOM? Call 911 or go to the emergency room if:  You faint.  You have difficulty breathing.   This information is not intended to replace advice given to you by your health care provider. Make sure you discuss any questions you have with your health care provider.   Document Released: 06/13/2004 Document Revised: 09/28/2014 Document Reviewed: 06/19/2014 Elsevier Interactive Patient Education [employer]2016 [employer] Inc[employer]

## 2016-01-26 NOTE — ED Notes (Signed)
Pt reports having 2 incisions on penis last week due to priapism. Was told that the stitches should be dissolvable. Also requesting some more pain medication.

## 2019-09-04 ENCOUNTER — Emergency Department (HOSPITAL_BASED_OUTPATIENT_CLINIC_OR_DEPARTMENT_OTHER): Payer: Self-pay

## 2019-09-04 ENCOUNTER — Other Ambulatory Visit: Payer: Self-pay

## 2019-09-04 ENCOUNTER — Emergency Department (HOSPITAL_BASED_OUTPATIENT_CLINIC_OR_DEPARTMENT_OTHER)
Admission: EM | Admit: 2019-09-04 | Discharge: 2019-09-05 | Disposition: A | Discharge status: Home / Self Care | Payer: Self-pay | Attending: Emergency Medicine | Admitting: Emergency Medicine

## 2019-09-04 ENCOUNTER — Encounter (HOSPITAL_BASED_OUTPATIENT_CLINIC_OR_DEPARTMENT_OTHER): Payer: Self-pay | Admitting: *Deleted

## 2019-09-04 DIAGNOSIS — Z87891 Personal history of nicotine dependence: Secondary | ICD-10-CM | POA: Insufficient documentation

## 2019-09-04 DIAGNOSIS — Y990 Civilian activity done for income or pay: Secondary | ICD-10-CM | POA: Insufficient documentation

## 2019-09-04 DIAGNOSIS — S62524A Nondisplaced fracture of distal phalanx of right thumb, initial encounter for closed fracture: Secondary | ICD-10-CM | POA: Insufficient documentation

## 2019-09-04 DIAGNOSIS — Y929 Unspecified place or not applicable: Secondary | ICD-10-CM | POA: Insufficient documentation

## 2019-09-04 DIAGNOSIS — W208XXA Other cause of strike by thrown, projected or falling object, initial encounter: Secondary | ICD-10-CM | POA: Insufficient documentation

## 2019-09-04 DIAGNOSIS — Y9389 Activity, other specified: Secondary | ICD-10-CM | POA: Insufficient documentation

## 2019-09-04 NOTE — ED Triage Notes (Signed)
Right thumb injury while moving a ladder today.

## 2019-09-04 NOTE — ED Notes (Signed)
Pt. Reports a Ladder falling onto his R thumb today causing injury to the R thumb.  Pt. Has edema noted and reports pain in the R thumb.  No significant amt of pain  7/10 with Ice Pk placed on the Thumb.

## 2019-09-05 MED ORDER — OXYCODONE-ACETAMINOPHEN 5-325 MG PO TABS
1.0000 | ORAL_TABLET | ORAL | 0 refills | Status: DC | PRN
Start: 2019-09-05 — End: 2023-02-24

## 2019-09-05 MED ORDER — OXYCODONE-ACETAMINOPHEN 5-325 MG PO TABS
1.0000 | ORAL_TABLET | Freq: Once | ORAL | Status: AC
  Administered 2019-09-05: 01:00:00 1 via ORAL
  Filled 2019-09-05: qty 1

## 2019-09-05 NOTE — ED Provider Notes (Signed)
Emergency Department Provider Note   I have reviewed the triage vital signs and the nursing notes.   HISTORY  Chief Complaint Finger Injury   HPI Gabriel Thomas is a 33 y.o. male without significant past medical history presents the emergency department today with thumb pain.  Patient states that he was moving a ladder and it fell on his right distal thumb causing pain and swelling to the area.  Patient states that he was able to work the rest the day but the thumb hurt significantly worse, worsening swelling with any head movement and at one point did appear to be deformed so he presents here for further evaluation.  No other injuries.  Has been using ice with some relief.   No other associated or modifying symptoms.    History reviewed. No pertinent past medical history.  Patient Active Problem List   Diagnosis Date Noted  . Pharyngitis 02/23/2012  . Lymphadenitis 02/23/2012  . Fatigue 02/23/2012  . GROIN PAIN 10/21/2009  . TOBACCO USE, QUIT 10/21/2009  . LOW BACK PAIN 08/08/2008  . TINEA PEDIS 10/12/2007  . HEADACHE 10/12/2007    Past Surgical History:  Procedure Laterality Date  . ORIF FINGER / THUMB FRACTURE      Current Outpatient Rx  . Order #: [id number] Class: Normal    Allergies Patient has no known allergies.  Family History  Problem Relation Age of Onset  . Arthritis Mother   . Diabetes Mother   . Hypertension Mother     Social History Social History   Tobacco Use  . Smoking status: Former Research scientist (life sciences)  . Smokeless tobacco: Never Used  Substance Use Topics  . Alcohol use: Yes  . Drug use: No    Review of Systems  All other systems negative except as documented in the HPI. All pertinent positives and negatives as reviewed in the HPI. ____________________________________________   PHYSICAL EXAM:  VITAL SIGNS: ED Triage Vitals  Enc Vitals Group     BP 09/04/19 2227 127/74     Pulse Rate 09/04/19 2227 64     Resp 09/04/19 2227 16     Temp  09/04/19 2227 98.5 F (36.9 C)     Temp Source 09/04/19 2227 Oral     SpO2 09/04/19 2227 100 %     Weight 09/04/19 2224 185 lb (83.9 kg)     Height 09/04/19 2224 5\' 7"  (1.702 m)    Constitutional: Alert and oriented. Well appearing and in no acute distress. Eyes: Conjunctivae are normal. PERRL. EOMI. Head: Atraumatic. Nose: No congestion/rhinnorhea. Mouth/Throat: Mucous membranes are moist.  Oropharynx non-erythematous. Neck: No stridor.  No meningeal signs.   Cardiovascular: Normal rate, regular rhythm. Good peripheral circulation. Grossly normal heart sounds.   Respiratory: Normal respiratory effort.  No retractions. Lungs CTAB. Gastrointestinal: Soft and nontender. No distention.  Musculoskeletal: No lower extremity tenderness nor edema. No gross deformities of extremities. ttp over right distal phalanx of thumb Neurologic:  Normal speech and language. No gross focal neurologic deficits are appreciated.  Skin:  Skin is warm, dry and intact. No rash noted.   ____________________________________________   RADIOLOGY  DG Finger Thumb Right  Result Date: 09/04/2019 CLINICAL DATA:  Pain EXAM: RIGHT THUMB 2+V COMPARISON:  None. FINDINGS: There is a probable nondisplaced fracture distal phalanx of the first digit. There is surrounding soft tissue swelling. There is no dislocation. No radiopaque foreign body. IMPRESSION: Probable nondisplaced fracture of the distal phalanx of the first digit with surrounding soft tissue swelling. Electronically Signed  By: Katherine Mantle M.D.   On: 09/04/2019 22:42    ____________________________________________   INITIAL IMPRESSION / ASSESSMENT AND PLAN / ED COURSE  Nondisplaced distal phalanx fracture.  Splint applied.  Work note given.  Hand follow-up suggested.     Pertinent labs & imaging results that were available during my care of the patient were reviewed by me and considered in my medical decision making (see chart for details).  A  medical screening exam was performed and I feel the patient has had an appropriate workup for their chief complaint at this time and likelihood of emergent condition existing is low. They have been counseled on decision, discharge, follow up and which symptoms necessitate immediate return to the emergency department. They or their family verbally stated understanding and agreement with plan and discharged in stable condition.   ____________________________________________  FINAL CLINICAL IMPRESSION(S) / ED DIAGNOSES  Final diagnoses:  Closed nondisplaced fracture of distal phalanx of right thumb, initial encounter     MEDICATIONS GIVEN DURING THIS VISIT:  Medications  oxyCODONE-acetaminophen (PERCOCET/ROXICET) 5-325 MG per tablet 1 tablet (1 tablet Oral Given 09/05/19 0033)     NEW OUTPATIENT MEDICATIONS STARTED DURING THIS VISIT:  Discharge Medication List as of 09/05/2019 12:34 AM    START taking these medications   Details  oxyCODONE-acetaminophen (PERCOCET) 5-325 MG tablet Take 1 tablet by mouth every 4 (four) hours as needed., Starting Tue 09/05/2019, Normal        Note:  This note was prepared with assistance of Dragon voice recognition software. Occasional wrong-word or sound-a-like substitutions may have occurred due to the inherent limitations of voice recognition software.   Berlyn Malina, Barbara Cower, MD 09/05/19 0120

## 2021-06-17 IMAGING — CR DG FINGER THUMB 2+V*R*
3 series · 3 of 3 positions shown · non-contrast
Comparison: None.

CLINICAL DATA: Pain

EXAM:
RIGHT THUMB 2+V

[x finger pa right]
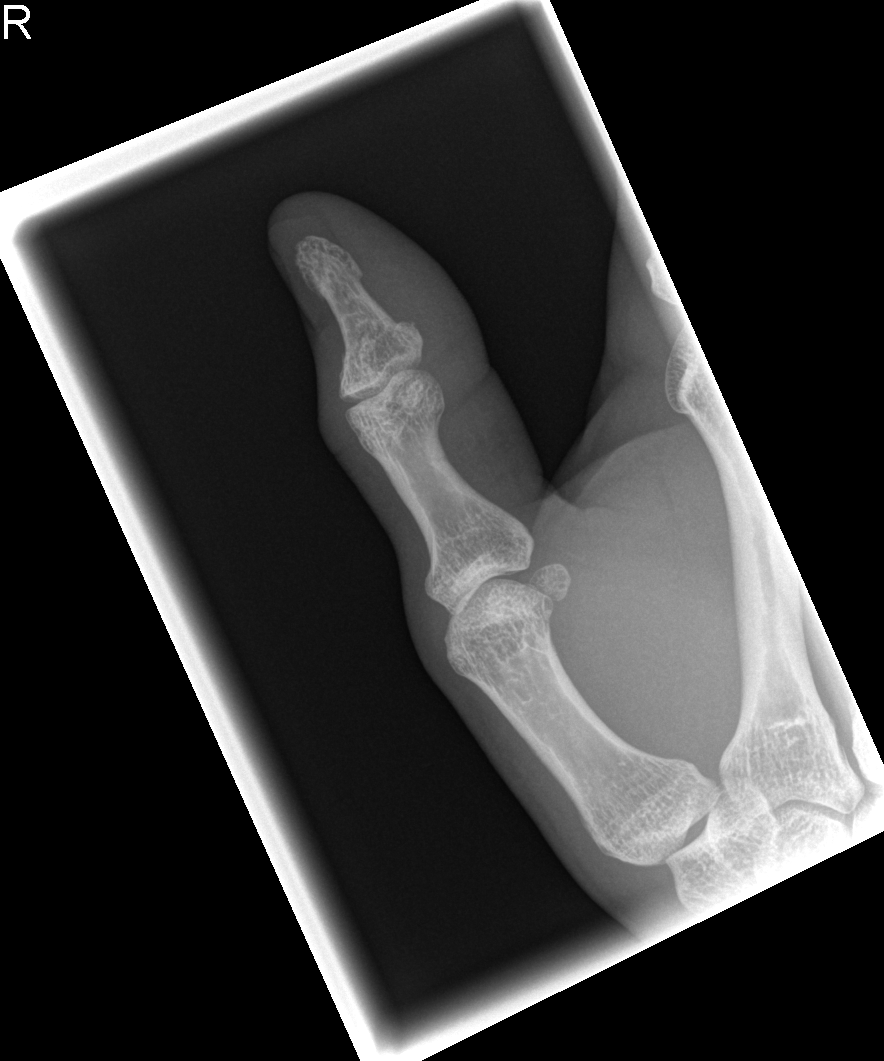

[x finger obl. right]
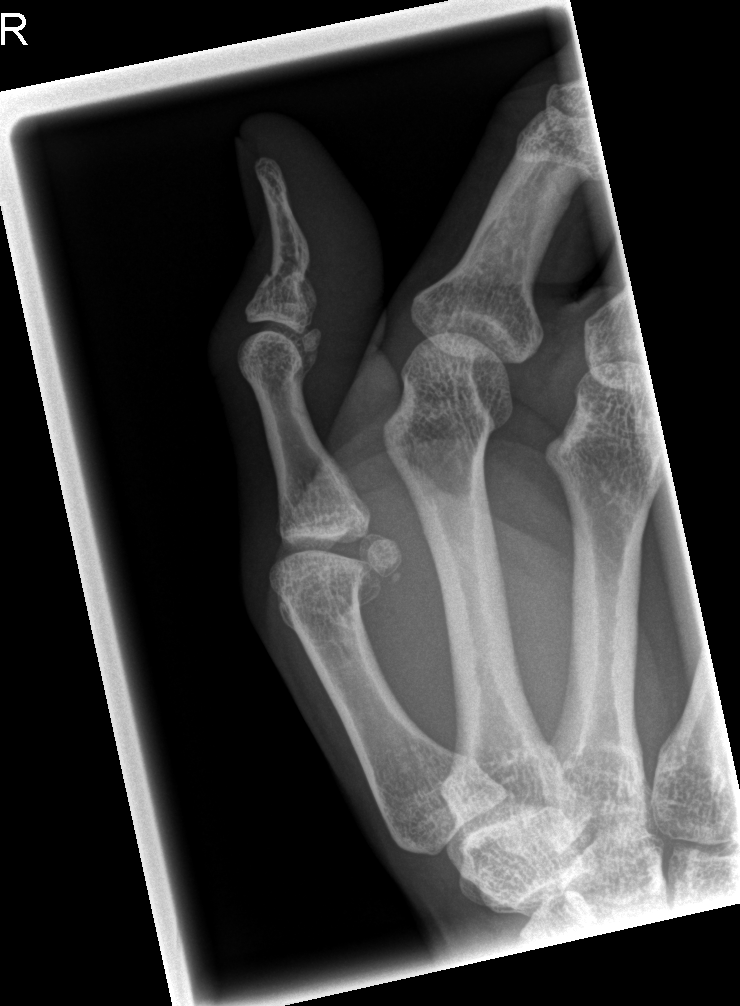

[x finger lateral right]
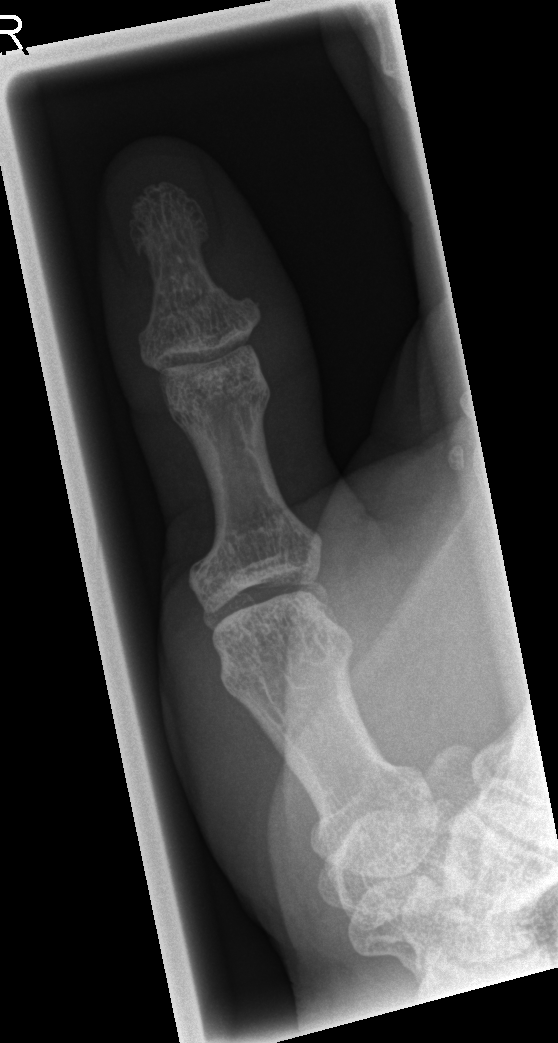

[3 of 3 positions shown; findings below may reference images not displayed]

FINDINGS: There is a probable nondisplaced fracture distal phalanx of the
first digit. There is surrounding soft tissue swelling. There is no
dislocation. No radiopaque foreign body.
IMPRESSION: Probable nondisplaced fracture of the distal phalanx of the first
digit with surrounding soft tissue swelling.

## 2021-10-20 DIAGNOSIS — M19041 Primary osteoarthritis, right hand: Secondary | ICD-10-CM | POA: Insufficient documentation

## 2022-10-17 ENCOUNTER — Emergency Department (HOSPITAL_COMMUNITY)
Admission: EM | Admit: 2022-10-17 | Discharge: 2022-10-18 | Disposition: A | Discharge status: Home / Self Care | Payer: Self-pay | Attending: Emergency Medicine | Admitting: Emergency Medicine

## 2022-10-17 ENCOUNTER — Other Ambulatory Visit: Payer: Self-pay

## 2022-10-17 DIAGNOSIS — J984 Other disorders of lung: Secondary | ICD-10-CM | POA: Insufficient documentation

## 2022-10-17 DIAGNOSIS — R7309 Other abnormal glucose: Secondary | ICD-10-CM | POA: Insufficient documentation

## 2022-10-17 DIAGNOSIS — R55 Syncope and collapse: Secondary | ICD-10-CM | POA: Insufficient documentation

## 2022-10-17 NOTE — ED Provider Triage Note (Signed)
Emergency Medicine Provider Triage Evaluation Note  Gabriel Thomas , a 37 y.o. male  was evaluated in triage.  Pt complains of chest pain and syncope.  Patient's was in his usual state of health today when he returned home.  He states that he took off his hoodie and began to feel lightheaded.  He had a prodrome and then syncopized.  He states that he was told he was out for about 30 seconds.  He has had some chest tightness since the incident.  He does not think that he hit his head and does not currently have any headache.  No preceding nausea, vomiting, diarrhea.  He has been eating and drinking well.  He has passed out once before in his life when he has his wisdom teeth out.  Reports febrile illness with high fever about a week ago, treated with OTC meds.  He was not tested for flu or COVID, but recovered.  No family history of heart disease or arrhythmia.  Denies drugs or alcohol.  Transported by EMS.  Review of Systems  Positive: Syncope, chest pain Negative: Vomiting, diarrhea  Physical Exam  BP 121/70   Pulse 81   Temp 98.4 F (36.9 C) (Oral)   Resp 16   Ht 5\' 7"  (1.702 m)   Wt 81.6 kg   SpO2 99%   BMI 28.19 kg/m  Gen:   Awake, no distress   Resp:  Normal effort  MSK:   Moves extremities without difficulty  Other:  Heart normal rate and regular rhythm.  Medical Decision Making  Medically screening exam initiated at 11:54 PM.  Appropriate orders placed.  Rodert Hinch was informed that the remainder of the evaluation will be completed by another provider, this initial triage assessment does not replace that evaluation, and the importance of remaining in the ED until their evaluation is complete.     Carlisle Cater, PA-C 10/17/22 2357

## 2022-10-18 ENCOUNTER — Encounter (HOSPITAL_COMMUNITY): Payer: Self-pay

## 2022-10-18 ENCOUNTER — Emergency Department (HOSPITAL_COMMUNITY): Payer: Self-pay

## 2022-10-18 LAB — BASIC METABOLIC PANEL
Anion gap: 9 (ref 5–15)
BUN: 10 mg/dL (ref 6–20)
CO2: 27 mmol/L (ref 22–32)
Calcium: 9.2 mg/dL (ref 8.9–10.3)
Chloride: 103 mmol/L (ref 98–111)
Creatinine, Ser: 1.06 mg/dL (ref 0.61–1.24)
GFR, Estimated: >60 mL/min (ref >60)
Glucose, Bld: 117 mg/dL — ABNORMAL HIGH (ref 70–99)
Potassium: 3.8 mmol/L (ref 3.5–5.1)
Sodium: 139 mmol/L (ref 135–145)

## 2022-10-18 LAB — CBC
HCT: 42.9 % (ref 39.0–52.0)
Hemoglobin: 13.8 g/dL (ref 13.0–17.0)
MCH: 29.5 pg (ref 26.0–34.0)
MCHC: 32.2 g/dL (ref 30.0–36.0)
MCV: 91.7 fL (ref 80.0–100.0)
Platelets: 274 10*3/uL (ref 150–400)
RBC: 4.68 MIL/uL (ref 4.22–5.81)
RDW: 13 % (ref 11.5–15.5)
WBC: 4.5 10*3/uL (ref 4.0–10.5)
nRBC: 0 % (ref 0.0–0.2)

## 2022-10-18 LAB — TROPONIN I (HIGH SENSITIVITY)
Troponin I (High Sensitivity): 3 ng/L (ref <18)
Troponin I (High Sensitivity): 3 ng/L (ref <18)

## 2022-10-18 MED ORDER — IOHEXOL 350 MG/ML SOLN
75.0000 mL | Freq: Once | INTRAVENOUS | Status: AC | PRN
  Administered 2022-10-18: 75 mL via INTRAVENOUS

## 2022-10-18 NOTE — ED Provider Notes (Signed)
[place] EMERGENCY DEPARTMENT AT [facility] Provider Note   CSN: [id number] Arrival date & time: 10/17/22  2331     History  Chief Complaint  Patient presents with   Loss of Consciousness    Gabriel Thomas is a 37 y.o. male.  37 year old male presents to the emergency department for complaints of chest pain and syncope.  Patient was in his usual state of health today when he returned home from [place].  He states that he took off his hoodie and began to feel lightheaded.  He had a prodrome and then syncopized.  Friend states that he was told he was out for about 25 seconds.  When regaining consciousness, patient noted some tightness in his midsternal region and left chest.  This has been spontaneously improving without intervention.  Describes the discomfort as an aching, squeezing pain.  It is nonradiating.  No preceding nausea, vomiting, diarrhea.  He has been eating and drinking well.  Reports febrile illness with high fever about 1-2 weeks ago, treated with OTC meds.  He was not tested for flu or COVID, but recovered.  No family history of heart disease or arrhythmia.  Denies drug or alcohol use.  The history is provided by the patient. No language interpreter was used.  Loss of Consciousness      Home Medications Prior to Admission medications   Medication Sig Start Date End Date Taking? Authorizing Provider  oxyCODONE-acetaminophen (PERCOCET) 5-325 MG tablet Take 1 tablet by mouth every 4 (four) hours as needed. 09/05/19   Mesner, Barbara Cower, MD  azithromycin (ZITHROMAX Z-PAK) 250 MG tablet As dirrected 02/23/12 09/05/19  Plotnikov, Georgina Quint, MD      Allergies    Patient has no known allergies.    Review of Systems   Review of Systems  Cardiovascular:  Positive for syncope.  Ten systems reviewed and are negative for acute change, except as noted in the HPI.    Physical Exam Updated Vital Signs BP 111/63   Pulse 66   Temp 97.7 F (36.5 C) (Oral)   Resp 17   Ht  5\' 7"  (1.702 m)   Wt 81.6 kg   SpO2 96%   BMI 28.19 kg/m   Physical Exam Vitals and nursing note reviewed.  Constitutional:      General: He is not in acute distress.    Appearance: He is well-developed. He is not diaphoretic.     Comments: Nontoxic appearing and in NAD  HENT:     Head: Normocephalic and atraumatic.  Eyes:     General: No scleral icterus.    Conjunctiva/sclera: Conjunctivae normal.  Cardiovascular:     Rate and Rhythm: Normal rate and regular rhythm.     Pulses: Normal pulses.  Pulmonary:     Effort: Pulmonary effort is normal. No respiratory distress.     Breath sounds: No stridor.     Comments: Respirations even and unlabored Musculoskeletal:        General: Normal range of motion.     Cervical back: Normal range of motion.     Right lower leg: No edema.     Left lower leg: No edema.     Comments: No BLE edema.  Skin:    General: Skin is warm and dry.     Coloration: Skin is not pale.     Findings: No erythema or rash.  Neurological:     Mental Status: He is alert and oriented to person, place, and time.  Coordination: Coordination normal.  Psychiatric:        Behavior: Behavior normal.     ED Results / Procedures / Treatments   Labs (all labs ordered are listed, but only abnormal results are displayed) Labs Reviewed  BASIC METABOLIC PANEL - Abnormal; Notable for the following components:      Result Value   Glucose, Bld 117 (*)    All other components within normal limits  CBC  TROPONIN I (HIGH SENSITIVITY)  TROPONIN I (HIGH SENSITIVITY)    EKG EKG Interpretation  Date/Time:  Saturday October 17 2022 23:43:51 EST Ventricular Rate:  84 PR Interval:  128 QRS Duration: 114 QT Interval:  358 QTC Calculation: 424 R Axis:   70 Text Interpretation: Sinus rhythm Borderline intraventricular conduction delay RSR' in V1 or V2, probably normal variant Borderline ST elevation, anterior leads Confirmed by Addison Lank [phone]) on 10/18/2022  4:32:04 AM  Radiology CT Angio Chest PE W and/or Wo Contrast  Result Date: 10/18/2022 CLINICAL DATA:  Syncope with chest tightness. EXAM: CT ANGIOGRAPHY CHEST WITH CONTRAST TECHNIQUE: Multidetector CT imaging of the chest was performed using the standard protocol during bolus administration of intravenous contrast. Multiplanar CT image reconstructions and MIPs were obtained to evaluate the vascular anatomy. RADIATION DOSE REDUCTION: This exam was performed according to the departmental dose-optimization program which includes automated exposure control, adjustment of the mA and/or kV according to patient size and/or use of iterative reconstruction technique. CONTRAST:  35mL OMNIPAQUE IOHEXOL 350 MG/ML SOLN COMPARISON:  None Available. FINDINGS: Cardiovascular: The heart size is normal. No substantial pericardial effusion. No thoracic aortic aneurysm. No substantial atherosclerosis of the thoracic aorta. There is no filling defect within the opacified pulmonary arteries to suggest the presence of an acute pulmonary embolus. Mediastinum/Nodes: No mediastinal lymphadenopathy. There is no hilar lymphadenopathy. The esophagus has normal imaging features. There is no axillary lymphadenopathy. Lungs/Pleura: 4.6 cm thin walled air cyst noted right middle lobe without mural nodularity or irregular mural thickening. No suspicious pulmonary nodule or mass. No focal airspace consolidation. No pleural effusion. Upper Abdomen: Unremarkable. Musculoskeletal: No worrisome lytic or sclerotic osseous abnormality. Review of the MIP images confirms the above findings. IMPRESSION: 1. No CT evidence for acute pulmonary embolus. 2. No acute findings to explain the patient's history of syncope with chest tightness. Electronically Signed   By: Misty Stanley M.D.   On: 10/18/2022 05:59   DG Chest 2 View  Result Date: 10/18/2022 CLINICAL DATA:  Pain and syncope EXAM: CHEST - 2 VIEW COMPARISON:  02/23/2022 FINDINGS: The heart size and  mediastinal contours are within normal limits. Peripherally calcified mass measuring 5.3 cm in the right middle lobe was not present on 02/24/2012. No pleural effusion or pneumothorax. No displaced rib fractures. IMPRESSION: Peripherally calcified mass in the right middle lobe is indeterminate. Recommend CT chest with IV contrast for further evaluation. Electronically Signed   By: Placido Sou M.D.   On: 10/18/2022 00:29    Procedures Procedures    Medications Ordered in ED Medications  iohexol (OMNIPAQUE) 350 MG/ML injection 75 mL (75 mLs Intravenous Contrast Given 10/18/22 0459)    ED Course/ Medical Decision Making/ A&P Clinical Course as of 10/18/22 8469  Nancy Fetter Oct 18, 2022  0520 I have visualized the patient's CTA and do not see evidence of pulmonary embolus.  Well-circumscribed area of calcification, again, noted.  Pending formal radiologist interpretation. [KH]  0544 Repeat troponin negative. [GE]    Clinical Course User Index [KH] Antonietta Breach, PA-C  Medical Decision Making Amount and/or Complexity of Data Reviewed Radiology: ordered.  Risk Prescription drug management.   This patient presents to the ED for concern of syncope, this involves an extensive number of treatment options, and is a complaint that carries with it a high risk of complications and morbidity.  The differential diagnosis includes vasovagal response vs arrhythmia vs hypoglycemia vs seizure vs intoxication   Co morbidities that complicate the patient evaluation  None    Additional history obtained:  Additional history obtained from friend, at bedside, and EMS External records from outside source obtained and reviewed including prior CXR from 2013 which was normal.   Lab Tests:  I Ordered, and personally interpreted labs.  The pertinent results include:  normal CBC, BMP, Troponin x2. Glucose 117.   Imaging Studies ordered:  I ordered imaging studies including CXR  and CTA chest  I independently visualized and interpreted imaging which showed air cyst of R lung without other acute abnormality. No pulmonary embolus. I agree with the radiologist interpretation   Cardiac Monitoring:  The patient was maintained on a cardiac monitor.  I personally viewed and interpreted the cardiac monitored which showed an underlying rhythm of: NSR   Medicines ordered and prescription drug management:  I have reviewed the patients home medicines and have made adjustments as needed   Test Considered:  D dimer   Problem List / ED Course:  As above [place] syncope score is c/w low risk syncope   Reevaluation:  After the interventions noted above, I reevaluated the patient and found that they have :stayed the same   Social Determinants of Health:  Uninsured patient   Dispostion:  After consideration of the diagnostic results and the patients response to treatment, I feel that the patent would benefit from outpatient primary care follow-up as needed.  Considered admission, but workup today is consistent with low risk syncope.  Return precautions discussed and provided.  Patient discharged in stable condition with no unaddressed concerns.         Final Clinical Impression(s) / ED Diagnoses Final diagnoses:  Syncope, unspecified syncope type  Single cyst of lung    Rx / DC Orders ED Discharge Orders     None         Antonietta Breach, PA-C 10/18/22 6195    Fatima Blank, MD 10/18/22 [phone]

## 2022-10-18 NOTE — Discharge Instructions (Addendum)
Your workup in the emergency department today was reassuring.  We believe that you suffered from an episode of vasovagal syncope.  Be sure to remain well-hydrated and drink plenty of fluids.  Your evaluation did note an incidental air cyst in your right lung which has likely been there for a number of years.  This can be followed by a primary care doctor, if desired.  Return for any new or concerning symptoms.

## 2023-01-01 ENCOUNTER — Emergency Department (HOSPITAL_COMMUNITY)
Admission: EM | Admit: 2023-01-01 | Discharge: 2023-01-01 | Disposition: A | Discharge status: Home / Self Care | Payer: Self-pay | Attending: Emergency Medicine | Admitting: Emergency Medicine

## 2023-01-01 ENCOUNTER — Encounter (HOSPITAL_COMMUNITY): Payer: Self-pay

## 2023-01-01 DIAGNOSIS — N4830 Priapism, unspecified: Secondary | ICD-10-CM | POA: Insufficient documentation

## 2023-01-01 MED ORDER — TERBUTALINE SULFATE 1 MG/ML IJ SOLN
0.2500 mg | Freq: Once | INTRAMUSCULAR | Status: AC
  Administered 2023-01-01: 0.25 mg via SUBCUTANEOUS
  Filled 2023-01-01: qty 0.25

## 2023-01-01 MED ORDER — PHENYLEPHRINE 200 MCG/ML FOR PRIAPISM / HYPOTENSION
200.0000 ug | INTRAMUSCULAR | Status: DC | PRN
  Filled 2023-01-01: qty 50

## 2023-01-01 NOTE — ED Triage Notes (Signed)
Pt states he has had an erection for about 13 hours. Pt c/o pain. Pt states this has happened one other time in the past.

## 2023-01-01 NOTE — Discharge Instructions (Signed)
If you have recurrence of an extended erection, come to the emergency department when it has been present for 4 hours.  Do not wait longer or it can lead to erectile dysfunction.

## 2023-01-01 NOTE — ED Notes (Signed)
Patient given ice packs

## 2023-01-01 NOTE — ED Provider Notes (Signed)
[place] EMERGENCY DEPARTMENT AT [facility] Provider Note   CSN: [id number] Arrival date & time: 01/01/23  0013     History  Chief Complaint  Patient presents with   Groin Pain    Gabriel Thomas is a 37 y.o. male.  The history is provided by the patient.  Groin Pain  He comes to the emergency department because of persistent erection.  He developed an erection about 9:30 AM, and it has been present all day.  He denies use of any erectile dysfunction drugs.  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   Home Medications Prior to Admission medications   Medication Sig Start Date End Date Taking? Authorizing Provider  oxyCODONE-acetaminophen (PERCOCET) 5-325 MG tablet Take 1 tablet by mouth every 4 (four) hours as needed. 09/05/19   Mesner, Barbara Cower, MD  azithromycin (ZITHROMAX Z-PAK) 250 MG tablet As dirrected 02/23/12 09/05/19  Plotnikov, Georgina Quint, MD      Allergies    Patient has no known allergies.    Review of Systems   Review of Systems  All other systems reviewed and are negative.   Physical Exam Updated Vital Signs BP (!) 150/90   Pulse 68   Temp 98.5 F (36.9 C) (Oral)   Resp 19   SpO2 100%  Physical Exam Vitals and nursing note reviewed.   38 year old male, resting comfortably and in no acute distress. Vital signs are significant for elevated blood pressure. Oxygen saturation is 100%, which is normal. Head is normocephalic and atraumatic. PERRLA, EOMI. Lungs are clear without rales, wheezes, or rhonchi. Chest is nontender. Heart has regular rate and rhythm without murmur. Abdomen is soft, flat, nontender. Genitalia: Circumcised penis with erection present.  Testes descended without masses.  No inguinal adenopathy. Extremities have no cyanosis or edema, full range of motion is present. Skin is warm and dry without rash. Neurologic: Mental status is normal,  cranial nerves are intact, moves all extremities equally.  ED Results / Procedures / Treatments   Labs (all labs ordered are listed, but only abnormal results are displayed) Labs Reviewed - No data to display  EKG None  Radiology No results found.  Procedures Procedures    Medications Ordered in ED Medications  phenylephrine 200 mcg / ml CONC. DILUTION INJ (ED / Urology USE ONLY) (has no administration in time range)    ED Course/ Medical Decision Making/ A&P                             Medical Decision Making Risk Prescription drug management.   Priapism.  I am concerned about the length of time the erection has been present.  I have ordered phenylephrine to be injected into the corpus cavernosum.  I have reviewed his old records, and he actually has an ED visit on 06/09/2014 for priapism which was treated with terbutaline (patient refused intracavernosal injection at that time).  He was referred to urology, but apparently did not follow-up.  1:28 AM Patient is refusing intracavernosal injection.  I have explained to him that second line treatment does not work as quickly and is not as effective and therefore gives a higher risk of long-term erectile dysfunction.  He states he understands this and still does not want to have the intracavernosal injection.  He states that the erection has gone down slightly.  I have ordered subcutaneous terbutaline.  Following subcutaneous terbutaline, he had successful detumescence and is discharged with instructions to follow-up with urology.  Also advised not to come to the emergency department for any erection that lasts more than 4 hours.  Risk of permanent erectile dysfunction if he waits too long was explained.  Final Clinical Impression(s) / ED Diagnoses Final diagnoses:  Priapism    Rx / DC Orders ED Discharge Orders     None         Dione Booze, MD 01/01/23 [phone]

## 2023-01-05 ENCOUNTER — Other Ambulatory Visit: Payer: Self-pay

## 2023-01-05 ENCOUNTER — Encounter (HOSPITAL_COMMUNITY): Payer: Self-pay

## 2023-01-05 ENCOUNTER — Emergency Department (HOSPITAL_COMMUNITY)
Admission: EM | Admit: 2023-01-05 | Discharge: 2023-01-05 | Disposition: A | Discharge status: Home / Self Care | Payer: Self-pay | Attending: Emergency Medicine | Admitting: Emergency Medicine

## 2023-01-05 DIAGNOSIS — N4830 Priapism, unspecified: Secondary | ICD-10-CM | POA: Insufficient documentation

## 2023-01-05 LAB — CBC
HCT: 41.6 % (ref 39.0–52.0)
Hemoglobin: 13.9 g/dL (ref 13.0–17.0)
MCH: 29.8 pg (ref 26.0–34.0)
MCHC: 33.4 g/dL (ref 30.0–36.0)
MCV: 89.1 fL (ref 80.0–100.0)
Platelets: 274 10*3/uL (ref 150–400)
RBC: 4.67 MIL/uL (ref 4.22–5.81)
RDW: 12.6 % (ref 11.5–15.5)
WBC: 5.1 10*3/uL (ref 4.0–10.5)
nRBC: 0 % (ref 0.0–0.2)

## 2023-01-05 LAB — BASIC METABOLIC PANEL
Anion gap: 9 (ref 5–15)
BUN: 13 mg/dL (ref 6–20)
CO2: 26 mmol/L (ref 22–32)
Calcium: 9.4 mg/dL (ref 8.9–10.3)
Chloride: 104 mmol/L (ref 98–111)
Creatinine, Ser: 1.21 mg/dL (ref 0.61–1.24)
GFR, Estimated: >60 mL/min (ref >60)
Glucose, Bld: 94 mg/dL (ref 70–99)
Potassium: 3.9 mmol/L (ref 3.5–5.1)
Sodium: 139 mmol/L (ref 135–145)

## 2023-01-05 MED ORDER — HYDROMORPHONE HCL 1 MG/ML IJ SOLN
0.5000 mg | Freq: Once | INTRAMUSCULAR | Status: AC
  Administered 2023-01-05: 0.5 mg via INTRAVENOUS
  Filled 2023-01-05: qty 1

## 2023-01-05 MED ORDER — PHENYLEPHRINE 200 MCG/ML FOR PRIAPISM / HYPOTENSION
200.0000 ug | INTRAMUSCULAR | Status: DC | PRN
  Administered 2023-01-05: 200 ug via INTRACAVERNOUS
  Filled 2023-01-05: qty 50

## 2023-01-05 MED ORDER — LIDOCAINE HCL (PF) 1 % IJ SOLN
5.0000 mL | Freq: Once | INTRAMUSCULAR | Status: AC
  Administered 2023-01-05: 5 mL
  Filled 2023-01-05: qty 5

## 2023-01-05 MED ORDER — PHENYLEPHRINE 200 MCG/ML FOR PRIAPISM / HYPOTENSION
100.0000 ug | INTRAMUSCULAR | Status: AC | PRN
  Administered 2023-01-05 (×2): 100 ug via INTRACAVERNOUS
  Filled 2023-01-05: qty 50

## 2023-01-05 NOTE — ED Notes (Signed)
EDP at [facility] 

## 2023-01-05 NOTE — ED Notes (Signed)
EDP at [facility] Subjectively less firm/ softer. Rates pain decreased.

## 2023-01-05 NOTE — ED Notes (Signed)
No significant change. EDP into see.

## 2023-01-05 NOTE — ED Notes (Signed)
EDP at [facility] for repeat priapism procedure, w/ larger 18g needle. Tolerated well. Will monitor.

## 2023-01-05 NOTE — ED Notes (Signed)
Resting comfortably. No obvious significant change. Pt verbalizes "feel like it might be getting better". EDP updated.

## 2023-01-05 NOTE — ED Notes (Addendum)
EDP at [facility] for priapism procedure, 23g needle. Tolerated well. Will monitor.

## 2023-01-05 NOTE — ED Provider Notes (Signed)
[place] EMERGENCY DEPARTMENT AT [facility] Provider Note   CSN: [id number] Arrival date & time: 01/05/23  1014     History  Chief Complaint  Patient presents with   Erectile Dysfunction    Gabriel Thomas is a 37 y.o. male.   Erectile Dysfunction  Patient presents with priapism.  Has had a couple times previously.  Last episode 4 days ago.  Reportedly reviewing notes was given subcu terbutaline at that time since he refused drainage.  Had improved but today had another episode.  Began around 4 hours prior to arrival.  Denies medicines.  States he will occasionally use some marijuana but not recently.    History reviewed. No pertinent past medical history.  Home Medications Prior to Admission medications   Medication Sig Start Date End Date Taking? Authorizing Provider  oxyCODONE-acetaminophen (PERCOCET) 5-325 MG tablet Take 1 tablet by mouth every 4 (four) hours as needed. 09/05/19   Mesner, Barbara Cower, MD  azithromycin (ZITHROMAX Z-PAK) 250 MG tablet As dirrected 02/23/12 09/05/19  Plotnikov, Georgina Quint, MD      Allergies    Patient has no known allergies.    Review of Systems   Review of Systems  Physical Exam Updated Vital Signs BP 116/63   Pulse 65   Temp 98 F (36.7 C)   Resp 20   Ht  (1.702 m)   Wt 81.6 kg   SpO2 99%   BMI 28.19 kg/m  Physical Exam Vitals and nursing note reviewed.  HENT:     Head: Atraumatic.  Eyes:     Pupils: Pupils are equal, round, and reactive to light.  Cardiovascular:     Rate and Rhythm: Regular rhythm.  Genitourinary:    Comments: Patient has erect penis. Skin:    Capillary Refill: Capillary refill takes less than 2 seconds.  Neurological:     Mental Status: He is alert and oriented to person, place, and time.     ED Results / Procedures / Treatments   Labs (all labs ordered are listed, but only abnormal results are displayed) Labs Reviewed  CBC  BASIC METABOLIC PANEL    EKG None  Radiology No  results found.  Procedures Irrigate corpus cavern, priapism  Date/Time: 01/05/2023 11:45 AM  Performed by: Benjiman Core, MD Authorized by: Benjiman Core, MD  Consent: Verbal consent obtained. Risks and benefits: risks, benefits and alternatives were discussed Consent given by: patient Patient understanding: patient states understanding of the procedure being performed Patient consent: the patient's understanding of the procedure matches consent given Required items: required blood products, implants, devices, and special equipment available Patient identity confirmed: verbally with patient Preparation: Patient was prepped and draped in the usual sterile fashion. Local anesthesia used: yes Anesthesia: local infiltration  Anesthesia: Local anesthesia used: yes Local Anesthetic: lidocaine 1% without epinephrine Anesthetic total: 2 mL  Sedation: Patient sedated: no  Patient tolerance: patient tolerated the procedure well with no immediate complications Comments: Inject total of 200 mcg twice of phenylephrine.  Also had removal of small amount of blood.       Medications Ordered in ED Medications  phenylephrine 200 mcg / ml CONC. DILUTION INJ (ED / Urology USE ONLY) (200 mcg Intracavernosal Given by Other 01/05/23 1211)  phenylephrine 200 mcg / ml CONC. DILUTION INJ (ED / Urology USE ONLY) (100 mcg Intracavernosal Given by Other 01/05/23 1107)  lidocaine (PF) (XYLOCAINE) 1 % injection 5 mL (5 mLs Other Given 01/05/23 1048)  HYDROmorphone (DILAUDID) injection 0.5 mg (0.5  mg Intravenous Given 01/05/23 1047)  HYDROmorphone (DILAUDID) injection 0.5 mg (0.5 mg Intravenous Given 01/05/23 1221)    ED Course/ Medical Decision Making/ A&P                             Medical Decision Making Amount and/or Complexity of Data Reviewed Labs: ordered.  Risk Prescription drug management.   Patient with priapism.  Required drainage.  After drainage improved.  Due to messing.   States much improved.  Will have follow-up with urology.  Does use some marijuana but otherwise not a clear cause for repeated priapism.        Final Clinical Impression(s) / ED Diagnoses Final diagnoses:  Priapism    Rx / DC Orders ED Discharge Orders     None         Benjiman Core, MD 01/05/23 1535

## 2023-01-05 NOTE — ED Triage Notes (Signed)
Pt here for recurrent priapism. States erection x 4 hours on arrival to ED.

## 2023-01-06 ENCOUNTER — Emergency Department (HOSPITAL_COMMUNITY)
Admission: EM | Admit: 2023-01-06 | Discharge: 2023-01-06 | Disposition: A | Discharge status: Home / Self Care | Payer: Self-pay | Attending: Emergency Medicine | Admitting: Emergency Medicine

## 2023-01-06 ENCOUNTER — Other Ambulatory Visit: Payer: Self-pay

## 2023-01-06 ENCOUNTER — Encounter (HOSPITAL_COMMUNITY): Payer: Self-pay

## 2023-01-06 DIAGNOSIS — N4830 Priapism, unspecified: Secondary | ICD-10-CM | POA: Insufficient documentation

## 2023-01-06 MED ORDER — PHENYLEPHRINE 200 MCG/ML FOR PRIAPISM / HYPOTENSION
50.0000 ug | Freq: Once | INTRAMUSCULAR | Status: AC
  Administered 2023-01-06: 50 ug via INTRACAVERNOUS
  Filled 2023-01-06: qty 50

## 2023-01-06 MED ORDER — LIDOCAINE HCL 2 % IJ SOLN
5.0000 mL | Freq: Once | INTRAMUSCULAR | Status: AC
  Administered 2023-01-06: 100 mg
  Filled 2023-01-06: qty 20

## 2023-01-06 MED ORDER — HYDROMORPHONE HCL 1 MG/ML IJ SOLN
1.0000 mg | Freq: Once | INTRAMUSCULAR | Status: AC
  Administered 2023-01-06: 1 mg via INTRAVENOUS
  Filled 2023-01-06: qty 1

## 2023-01-06 MED ORDER — TADALAFIL 5 MG PO TABS: 5.0000 mg | ORAL_TABLET | Freq: Every day | ORAL | 0 refills | Status: DC

## 2023-01-06 MED ORDER — FINASTERIDE 5 MG PO TABS: 5.0000 mg | ORAL_TABLET | Freq: Every day | ORAL | 0 refills | Status: DC

## 2023-01-06 MED ORDER — KETOROLAC TROMETHAMINE 30 MG/ML IJ SOLN
30.0000 mg | Freq: Once | INTRAMUSCULAR | Status: AC
  Administered 2023-01-06: 30 mg via INTRAVENOUS
  Filled 2023-01-06: qty 1

## 2023-01-06 NOTE — ED Provider Notes (Signed)
[place] EMERGENCY DEPARTMENT AT [facility] Provider Note   CSN: [id number] Arrival date & time: 01/06/23  4098     History  Chief Complaint  Patient presents with  . priapism    Gabriel Thomas is a 37 y.o. male.  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  Patient states he has had several recurrent episodes recently.  He was seen in the emergency room on April 12 as well as yesterday for the same issue.  Yesterday the patient had phenylephrine injected into the corpus cavernosum.  Patient states he noticed improvement after that but this morning he woke up again with an erection that did not resolved.  It has been ongoing for 4 hours now and he is having severe pain.  Patient denies any history of drug use.  He denies any use of medications for erectile dysfunction.  Home Medications Prior to Admission medications   Medication Sig Start Date End Date Taking? Authorizing Provider  finasteride (PROSCAR) 5 MG tablet Take 1 tablet (5 mg total) by mouth daily. 01/06/23  Yes Linwood Dibbles, MD  tadalafil (CIALIS) 5 MG tablet Take 1 tablet (5 mg total) by mouth daily. For prevention priapism 01/06/23 02/05/23 Yes Linwood Dibbles, MD  oxyCODONE-acetaminophen (PERCOCET) 5-325 MG tablet Take 1 tablet by mouth every 4 (four) hours as needed. 09/05/19   Mesner, Barbara Cower, MD  azithromycin (ZITHROMAX Z-PAK) 250 MG tablet As dirrected 02/23/12 09/05/19  Plotnikov, Georgina Quint, MD      Allergies    Patient has no known allergies.    Review of Systems   Review of Systems  Physical Exam Updated Vital Signs BP 105/71   Pulse (!) 59   Temp 97.9 F (36.6 C)   Resp 17   Ht 1.702 m ( )   Wt 81.6 kg   SpO2 99%   BMI 28.19 kg/m  Physical Exam Vitals and nursing note reviewed.  Constitutional:      General: He is not in acute distress.    Appearance: He is well-developed.  HENT:     Head: Normocephalic and atraumatic.     Right Ear: External ear normal.      Left Ear: External ear normal.  Eyes:     General: No scleral icterus.       Right eye: No discharge.        Left eye: No discharge.     Conjunctiva/sclera: Conjunctivae normal.  Neck:     Trachea: No tracheal deviation.  Cardiovascular:     Rate and Rhythm: Normal rate.  Pulmonary:     Effort: Pulmonary effort is normal. No respiratory distress.     Breath sounds: No stridor.  Abdominal:     General: There is no distension.  Genitourinary:    Comments: Erect penis Musculoskeletal:        General: No swelling or deformity.     Cervical back: Neck supple.  Skin:    General: Skin is warm and dry.     Findings: No rash.  Neurological:     Mental Status: He is alert. Mental status is at baseline.     Cranial Nerves: No dysarthria or facial asymmetry.     Motor: No seizure activity.     ED Results / Procedures / Treatments   Labs (all labs ordered are listed, but only abnormal results are displayed) Labs Reviewed  URINALYSIS, ROUTINE W REFLEX MICROSCOPIC  RAPID URINE DRUG SCREEN, HOSP PERFORMED  EKG None  Radiology No results found.  Procedures Irrigate corpus cavern, priapism  Date/Time: 01/06/2023 9:23 AM  Performed by: Linwood Dibbles, MD Authorized by: Linwood Dibbles, MD  Consent: Verbal consent obtained. Risks and benefits: risks, benefits and alternatives were discussed Consent given by: patient Patient understanding: patient states understanding of the procedure being performed Patient identity confirmed: verbally with patient Time out: Immediately prior to procedure a "time out" was called to verify the correct patient, procedure, equipment, support staff and site/side marked as required. Local anesthesia used: yes Anesthesia: local infiltration  Anesthesia: Local anesthesia used: yes Local Anesthetic: lidocaine 2% without epinephrine Anesthetic total: 1 mL  Sedation: Patient sedated: no  Patient tolerance: patient tolerated the procedure well with no  immediate complications Comments: 2 cc blood withdrawn with 18 gauge needle right corpus cavernosum, 200 mch phenylephrine injected       Medications Ordered in ED Medications  phenylephrine 200 mcg / ml CONC. DILUTION INJ (ED / Urology USE ONLY) (50 mcg Intracavernosal Given by Other 01/06/23 0932)  lidocaine (XYLOCAINE) 2 % (with pres) injection 100 mg (100 mg Infiltration Given by Other 01/06/23 0933)  ketorolac (TORADOL) 30 MG/ML injection 30 mg (30 mg Intravenous Given 01/06/23 0838)  HYDROmorphone (DILAUDID) injection 1 mg (1 mg Intravenous Given 01/06/23 0936)  HYDROmorphone (DILAUDID) injection 1 mg (1 mg Intravenous Given 01/06/23 1127)    ED Course/ Medical Decision Making/ A&P Clinical Course as of 01/06/23 1316  Wed Jan 06, 2023  0922 Reviewed case with Dr. Pete Glatter urology.  Will proceed with phenylephrine injection again [JK]  1040 Discussed with Dr Pete Glatter. Pt with stuttering priapism.  Difficult to manage.Start him on finasteride 5 mg daily, counter intuitive but some benefit to low dose cialis, 5 mg daily [JK]  1315 Patient feeling better.  Priapism Erection has resolved [JK]    Clinical Course User Index [JK] Linwood Dibbles, MD                             Medical Decision Making Problems Addressed: Priapism, unspecified: acute illness or injury that poses a threat to life or bodily functions  Risk Prescription drug management.   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  Patient is now had several episodes in the last week.  He denies any drug use.  No history of sickle cell disease.  Unclear what is precipitating these episodes.  I discussed the case with Dr. Pete Glatter urology.  Patient did undergo recurrent aspiration and phenylephrine injection.  He had good response with detumescence.  Recommendation is to have patient take Cialis which paradoxically has shown to offer some benefit.  Recommendation also is for finasteride.  Patient states he would  like to talk to the urologist first about the Cialis.  Will have him schedule close outpatient follow-up for further evaluation.         Final Clinical Impression(s) / ED Diagnoses Final diagnoses:  Priapism, unspecified    Rx / DC Orders ED Discharge Orders          Ordered    finasteride (PROSCAR) 5 MG tablet  Daily        01/06/23 1311    tadalafil (CIALIS) 5 MG tablet  Daily        01/06/23 1311              Linwood Dibbles, MD 01/06/23 1316

## 2023-01-06 NOTE — ED Triage Notes (Signed)
Patient here for evaluation of priapism. Reports this one started at 0300 today. States that was seen at [facility] yesterday for the same and they had to drain it a little.

## 2023-01-06 NOTE — Discharge Instructions (Signed)
The urologist recommended to medications, Cialis and finasteride to help prevent recurrent episodes.  Contact the urologist office today to schedule close follow-up

## 2023-01-17 ENCOUNTER — Emergency Department (HOSPITAL_COMMUNITY)
Admission: EM | Admit: 2023-01-17 | Discharge: 2023-01-17 | Disposition: A | Discharge status: Home / Self Care | Payer: Self-pay | Attending: Emergency Medicine | Admitting: Emergency Medicine

## 2023-01-17 ENCOUNTER — Encounter (HOSPITAL_COMMUNITY): Payer: Self-pay

## 2023-01-17 DIAGNOSIS — N4830 Priapism, unspecified: Secondary | ICD-10-CM | POA: Insufficient documentation

## 2023-01-17 HISTORY — DX: Other priapism: N48.39

## 2023-01-17 LAB — BLOOD GAS, VENOUS
Acid-base deficit: 13.8 mmol/L — ABNORMAL HIGH (ref 0.0–2.0)
Bicarbonate: 21.7 mmol/L (ref 20.0–28.0)
O2 Saturation: 10.8 %
Patient temperature: 37
pCO2, Ven: 106 mmHg (ref 44–60)
pH, Ven: <6.95 — CL (ref 7.25–7.43)
pO2, Ven: <31 mmHg — CL (ref 32–45)

## 2023-01-17 MED ORDER — OXYCODONE-ACETAMINOPHEN 5-325 MG PO TABS
2.0000 | ORAL_TABLET | Freq: Once | ORAL | Status: AC
  Administered 2023-01-17: 2 via ORAL
  Filled 2023-01-17: qty 2

## 2023-01-17 MED ORDER — PHENYLEPHRINE 200 MCG/ML FOR PRIAPISM / HYPOTENSION
200.0000 ug | Freq: Once | INTRAMUSCULAR | Status: AC
  Administered 2023-01-17: 200 ug via INTRACAVERNOUS
  Filled 2023-01-17: qty 50

## 2023-01-17 MED ORDER — LIDOCAINE HCL 2 % IJ SOLN
10.0000 mL | Freq: Once | INTRAMUSCULAR | Status: AC
  Administered 2023-01-17: 200 mg via INTRADERMAL
  Filled 2023-01-17: qty 20

## 2023-01-17 MED ORDER — KETOROLAC TROMETHAMINE 60 MG/2ML IM SOLN
30.0000 mg | Freq: Once | INTRAMUSCULAR | Status: AC
  Administered 2023-01-17: 30 mg via INTRAMUSCULAR
  Filled 2023-01-17: qty 2

## 2023-01-17 NOTE — ED Triage Notes (Signed)
Pt arrived POV for recurrent stuttering priapism that has been occurring the past 4.5 hours. Pt reports 10/10 pain, very uncomfortable and difficulty ambulating d/t pain. Has been seen 3 times for same of the past few weeks, unable to get into urology until September.

## 2023-01-17 NOTE — Discharge Instructions (Signed)
Below is a telephone number for [facility].  Contact their office tomorrow morning to see if you can get scheduled for a close follow-up appointment.  You may have an underlying blood disorder causing the episodes.  There is further testing that you can do through a primary care doctor.  There is a telephone number below for a hematologist to you could also follow-up with for further testing.  Return to the emergency department for any further episodes.

## 2023-01-17 NOTE — ED Provider Notes (Signed)
[place] EMERGENCY DEPARTMENT AT [facility] Provider Note   CSN: [id number] Arrival date & time: 01/17/23  1191     History  Chief Complaint  Patient presents with   Stuttering Priapism    Gabriel Thomas is a 37 y.o. male.  HPI Patient presents for priapism.  This has been a recurrent issue for him over the past 2.5 weeks.  He was seen in the ED on 4/12, 4/16, and 4/17 for same issue.  He underwent corpus cavernosum irrigation and phenylephrine injection on prior to ED visits.  On the most recent prior ED visit, he was started on finasteride and Cialis.  He did not start the Cialis.  He took 2 doses of the finasteride but did not like the way it made him feel.  He has since discontinued use.  He has not yet followed up with urology.  He denies any prior episodes of this, however, on chart review, it does appear that he had an episode in 2015.  Today, he awoke from sleep with a painful erection.  This was 4.5 hours ago.  He has not taken anything for the pain.  Pain correction has been persistent since that time.    Home Medications Prior to Admission medications   Medication Sig Start Date End Date Taking? Authorizing Provider  finasteride (PROSCAR) 5 MG tablet Take 1 tablet (5 mg total) by mouth daily. 01/06/23  Yes Linwood Dibbles, MD  tadalafil (CIALIS) 5 MG tablet Take 1 tablet (5 mg total) by mouth daily. For prevention priapism 01/06/23 02/05/23 Yes Linwood Dibbles, MD  oxyCODONE-acetaminophen (PERCOCET) 5-325 MG tablet Take 1 tablet by mouth every 4 (four) hours as needed. Patient not taking: Reported on 01/17/2023 09/05/19   Mesner, Barbara Cower, MD  azithromycin (ZITHROMAX Z-PAK) 250 MG tablet As dirrected 02/23/12 09/05/19  Plotnikov, Georgina Quint, MD      Allergies    Aspirin, Primaquine, and Sulfa antibiotics    Review of Systems   Review of Systems  Genitourinary:  Positive for penile pain and penile swelling.    Physical Exam Updated Vital Signs BP 116/76   Pulse 62   Temp  98.2 F (36.8 C) (Oral)   Resp 17   Ht 5\' 7"  (1.702 m)   Wt 81.6 kg   SpO2 98%   BMI 28.19 kg/m  Physical Exam Vitals and nursing note reviewed.  Constitutional:      General: He is not in acute distress.    Appearance: Normal appearance. He is well-developed. He is not ill-appearing, toxic-appearing or diaphoretic.  HENT:     Head: Normocephalic and atraumatic.     Right Ear: External ear normal.     Left Ear: External ear normal.     Nose: Nose normal.     Mouth/Throat:     Mouth: Mucous membranes are moist.  Eyes:     Extraocular Movements: Extraocular movements intact.     Conjunctiva/sclera: Conjunctivae normal.  Cardiovascular:     Rate and Rhythm: Normal rate and regular rhythm.  Pulmonary:     Effort: Pulmonary effort is normal. No respiratory distress.  Abdominal:     General: There is no distension.     Palpations: Abdomen is soft.  Genitourinary:    Comments: Patient With flaccid glands consistent with low flow priapism Musculoskeletal:        General: No swelling. Normal range of motion.     Cervical back: Normal range of motion and neck supple.  Skin:  General: Skin is warm and dry.     Coloration: Skin is not jaundiced or pale.  Neurological:     General: No focal deficit present.     Mental Status: He is alert and oriented to person, place, and time.  Psychiatric:        Mood and Affect: Mood normal.        Behavior: Behavior normal.        Thought Content: Thought content normal.        Judgment: Judgment normal.     ED Results / Procedures / Treatments   Labs (all labs ordered are listed, but only abnormal results are displayed) Labs Reviewed  BLOOD GAS, VENOUS - Abnormal; Notable for the following components:      Result Value   pH, Ven <6.95 (*)    pCO2, Ven 106 (*)    pO2, Ven <31 (*)    Acid-base deficit 13.8 (*)    All other components within normal limits  URINALYSIS, ROUTINE W REFLEX MICROSCOPIC  RAPID URINE DRUG SCREEN, HOSP  PERFORMED    EKG None  Radiology No results found.  Procedures Irrigate corpus cavern, priapism  Date/Time: 01/17/2023 7:40 AM  Performed by: Gloris Manchester, MD Authorized by: Gloris Manchester, MD  Consent: Verbal consent obtained. Risks and benefits: risks, benefits and alternatives were discussed Consent given by: patient Patient understanding: patient states understanding of the procedure being performed Patient identity confirmed: verbally with patient Preparation: Patient was prepped and draped in the usual sterile fashion. Local anesthesia used: yes  Anesthesia: Local anesthesia used: yes Local Anesthetic: lidocaine 2% without epinephrine Anesthetic total: 1 mL  Sedation: Patient sedated: no  Patient tolerance: patient tolerated the procedure well with no immediate complications Comments: phenylephrine injected       Medications Ordered in ED Medications  phenylephrine 200 mcg / ml CONC. DILUTION INJ (ED / Urology USE ONLY) (200 mcg Intracavernosal Given 01/17/23 0722)  lidocaine (XYLOCAINE) 2 % (with pres) injection 200 mg (200 mg Intradermal Given 01/17/23 0721)  oxyCODONE-acetaminophen (PERCOCET/ROXICET) 5-325 MG per tablet 2 tablet (2 tablets Oral Given 01/17/23 0721)  ketorolac (TORADOL) injection 30 mg (30 mg Intramuscular Given 01/17/23 0919)    ED Course/ Medical Decision Making/ A&P                             Medical Decision Making Amount and/or Complexity of Data Reviewed Labs: ordered.  Risk Prescription drug management.   This patient presents to the ED for concern of priapism, this involves an extensive number of treatment options, and is a complaint that carries with it a high risk of complications and morbidity.  The differential diagnosis includes low-flow priapism, high flow priapism, medication side effect, undiagnosed hematologic disorder   Co morbidities that complicate the patient evaluation  Recurrent priapism   Additional  history obtained:  Additional history obtained from N/A External records from outside source obtained and reviewed including EMR   Lab Tests:  I Ordered, and personally interpreted labs.  The pertinent results include: Hypoxic, hypercarbic blood gas of cavernosal aspirate confirming low-flow priapism   Cardiac Monitoring: / EKG:  The patient was maintained on a cardiac monitor.  I personally viewed and interpreted the cardiac monitored which showed an underlying rhythm of: Sinus rhythm   Consultations Obtained:  I requested consultation with the urologist, Dr. Marlou Porch,  and discussed lab and imaging findings as well as pertinent plan - they recommend:  Consideration of Lupron injection to break cycle.  Other than this, no further acute management for recurrence prevention.   Problem List / ED Course / Critical interventions / Medication management  Patient presenting with stuttering priapism.  He has been seen in the ED 3 times prior over the past 2.5 weeks for the same.  Today's episode occurred 4.5 hours prior to arrival.  Patient has painful erection with radial shaft and flaccid glans consistent with low flow priapism.  Percocet was ordered for analgesia.  Patient underwent intracavernosal drainage, irrigation, and injection of phenylephrine.  I spoke with urologist, Dr. Marlou Porch, who states that a Lupron injection may break the cycle but this would be the equivalent of chemical castration for the next month.  Patient refused this.  Dr. Marlou Porch states that, short of this, there would be no other therapy is to avoid recurrence, however, these will often resolve over time on their own.  Following intracavernosal drainage, irrigation, and phenylephrine injection, patient had sustained tumescence while in the ED.  Currently, he has follow-up scheduled through [facility] in September.  He was advised to reach out to [facility] urology to obtain closer follow-up.  Contact information was  provided.  While in the ED, patient also stated that he underwent a thorough physical through [employer] [facility].  He was told that he had a "trait" but is unsure of which trait.  Patient may have sickle cell trait which might be contributing.  Only, he does not have a PCP.  He was given hematology contact information for follow-up for evaluation of hematologic disorder which may be contributing to his episodes.  Patient was discharged in stable condition. I ordered medication including phenylephrine for priapism; Percocet and Toradol for analgesia Reevaluation of the patient after these medicines showed that the patient resolved I have reviewed the patients home medicines and have made adjustments as needed   Social Determinants of Health:  Does not have PCP         Final Clinical Impression(s) / ED Diagnoses Final diagnoses:  Priapism    Rx / DC Orders ED Discharge Orders     None         Gloris Manchester, MD 01/17/23 1032

## 2023-01-20 ENCOUNTER — Encounter: Payer: Self-pay | Admitting: *Deleted

## 2023-01-20 NOTE — Progress Notes (Signed)
Reviewed referral with Dr Myna Hidalgo. At this time, Dr Myna Hidalgo doesn't believe that hematological workup is necessary for patient's continued priapism. There is no hematological cause for priapism except for sickle cell, and patient's documented H&P states that patient denies this diagnosis. Per Dr Myna Hidalgo, patient needs follow up with urology, and per chart review, referral has been made.   Will close the referral at this time.

## 2023-01-23 ENCOUNTER — Encounter (HOSPITAL_COMMUNITY): Payer: Self-pay | Admitting: *Deleted

## 2023-01-23 ENCOUNTER — Other Ambulatory Visit: Payer: Self-pay

## 2023-01-23 ENCOUNTER — Emergency Department (HOSPITAL_COMMUNITY)
Admission: EM | Admit: 2023-01-23 | Discharge: 2023-01-23 | Disposition: A | Discharge status: Home / Self Care | Payer: Self-pay | Attending: Student | Admitting: Student

## 2023-01-23 DIAGNOSIS — N4830 Priapism, unspecified: Secondary | ICD-10-CM | POA: Insufficient documentation

## 2023-01-23 DIAGNOSIS — Z87891 Personal history of nicotine dependence: Secondary | ICD-10-CM | POA: Insufficient documentation

## 2023-01-23 HISTORY — DX: Sickle-cell trait: D57.3

## 2023-01-23 MED ORDER — LIDOCAINE HCL 2 % IJ SOLN
10.0000 mL | Freq: Once | INTRAMUSCULAR | Status: AC
  Administered 2023-01-23: 200 mg via INTRADERMAL
  Filled 2023-01-23: qty 20

## 2023-01-23 MED ORDER — ONDANSETRON HCL 4 MG/2ML IJ SOLN
4.0000 mg | Freq: Once | INTRAMUSCULAR | Status: AC
  Filled 2023-01-23: qty 2

## 2023-01-23 MED ORDER — PHENYLEPHRINE 200 MCG/ML FOR PRIAPISM / HYPOTENSION
200.0000 ug | Freq: Once | INTRAMUSCULAR | Status: AC
  Administered 2023-01-23: 200 ug via INTRACAVERNOUS
  Filled 2023-01-23: qty 50

## 2023-01-23 MED ORDER — ONDANSETRON HCL 4 MG/2ML IJ SOLN
INTRAMUSCULAR | Status: AC
  Administered 2023-01-23: 4 mg via INTRAVENOUS
  Filled 2023-01-23: qty 2

## 2023-01-23 MED ORDER — MORPHINE SULFATE (PF) 4 MG/ML IV SOLN
4.0000 mg | Freq: Once | INTRAVENOUS | Status: AC
  Administered 2023-01-23: 4 mg via INTRAVENOUS
  Filled 2023-01-23: qty 1

## 2023-01-23 MED ORDER — HYDROMORPHONE HCL 1 MG/ML IJ SOLN
1.0000 mg | Freq: Once | INTRAMUSCULAR | Status: AC
  Administered 2023-01-23: 1 mg via INTRAVENOUS
  Filled 2023-01-23: qty 1

## 2023-01-23 NOTE — ED Provider Notes (Signed)
EMERGENCY DEPARTMENT AT [facility] Provider Note  CSN: [id number] Arrival date & time: 01/23/23 1134  Chief Complaint(s) stuttering priapism  HPI Gabriel Thomas is a 37 y.o. male with PMH recurrent priapism of unknown origin, sickle cell trait who presents emergency department for evaluation of priapism.  He has been seen 4 times in the month of April for the same complaint and has been unable to get into the outpatient urology clinic for an unknown reason.  He states that he woke up at 7 AM this morning with a persistent erection and arrives with a painful persistent erection consistent with his previous presentations for priapism.  He denies chest pain, shortness of breath, Donnell pain, nausea, vomiting or other systemic symptoms.  Denies cocaine use or trazodone use.   Past Medical History Past Medical History:  Diagnosis Date   Sickle cell trait (HCC)    Stuttering priapism    Patient Active Problem List   Diagnosis Date Noted   Pharyngitis 02/23/2012   Lymphadenitis 02/23/2012   Fatigue 02/23/2012   GROIN PAIN 10/21/2009   TOBACCO USE, QUIT 10/21/2009   LOW BACK PAIN 08/08/2008   TINEA PEDIS 10/12/2007   HEADACHE 10/12/2007   Home Medication(s) Prior to Admission medications   Medication Sig Start Date End Date Taking? Authorizing Provider  finasteride (PROSCAR) 5 MG tablet Take 1 tablet (5 mg total) by mouth daily. 01/06/23   Linwood Dibbles, MD  oxyCODONE-acetaminophen (PERCOCET) 5-325 MG tablet Take 1 tablet by mouth every 4 (four) hours as needed. Patient not taking: Reported on 01/17/2023 09/05/19   Mesner, Barbara Cower, MD  tadalafil (CIALIS) 5 MG tablet Take 1 tablet (5 mg total) by mouth daily. For prevention priapism 01/06/23 02/05/23  Linwood Dibbles, MD  azithromycin Christena Deem Z-PAK) 250 MG tablet As dirrected 02/23/12 09/05/19  Plotnikov, Georgina Quint, MD                                                                                                                                     Past Surgical History Past Surgical History:  Procedure Laterality Date   ORIF FINGER / THUMB FRACTURE     Family History Family History  Problem Relation Age of Onset   Arthritis Mother    Diabetes Mother    Hypertension Mother     Social History Social History   Tobacco Use   Smoking status: Former   Smokeless tobacco: Never  Substance Use Topics   Alcohol use: Yes   Drug use: No   Allergies Aspirin, Primaquine, and Sulfa antibiotics  Review of Systems Review of Systems  Genitourinary:  Positive for penile pain and penile swelling.    Physical Exam Vital Signs  I have reviewed the triage vital signs BP (!) 133/99   Pulse 78   Temp 97.7 F (36.5 C)   Resp 18   Ht 5\' 7"  (1.702 m)   Wt 79.4 kg  SpO2 100%   BMI 27.41 kg/m   Physical Exam Constitutional:      General: He is not in acute distress.    Appearance: Normal appearance.  HENT:     Head: Normocephalic and atraumatic.     Nose: No congestion or rhinorrhea.  Eyes:     General:        Right eye: No discharge.        Left eye: No discharge.     Extraocular Movements: Extraocular movements intact.     Pupils: Pupils are equal, round, and reactive to light.  Cardiovascular:     Rate and Rhythm: Normal rate and regular rhythm.     Heart sounds: No murmur heard. Pulmonary:     Effort: No respiratory distress.     Breath sounds: No wheezing or rales.  Abdominal:     General: There is no distension.     Tenderness: There is no abdominal tenderness.  Genitourinary:    Comments: Large painful erection Musculoskeletal:        General: Normal range of motion.     Cervical back: Normal range of motion.  Skin:    General: Skin is warm and dry.  Neurological:     General: No focal deficit present.     Mental Status: He is alert.     ED Results and Treatments Labs (all labs ordered are listed, but only abnormal results are displayed) Labs Reviewed - No data to display                                                                                                                         Radiology No results found.  Pertinent labs & imaging results that were available during my care of the patient were reviewed by me and considered in my medical decision making (see MDM for details).  Medications Ordered in ED Medications - No data to display                                                                                                                                   Procedures Irrigate corpus cavern, priapism  Date/Time: 01/23/2023 5:46 PM  Performed by: Glendora Score, MD Authorized by: Glendora Score, MD  Consent: Verbal consent obtained. Risks and benefits: risks, benefits and alternatives were discussed Consent given by: patient Patient understanding: patient states understanding of the procedure being performed Patient consent: the  patient's understanding of the procedure matches consent given Patient identity confirmed: verbally with patient Time out: Immediately prior to procedure a "time out" was called to verify the correct patient, procedure, equipment, support staff and site/side marked as required. Preparation: Patient was prepped and draped in the usual sterile fashion. Local anesthesia used: yes Anesthesia: nerve block (ring block)  Anesthesia: Local anesthesia used: yes Local Anesthetic: lidocaine 2% without epinephrine Anesthetic total: 8 mL  Sedation: Patient sedated: no  Patient tolerance: patient tolerated the procedure well with no immediate complications Comments: 200 mcg phenylephrine injected, appropriate detumescence     (including critical care time)  Medical Decision Making / ED Course   This patient presents to the ED for concern of priapism, this involves an extensive number of treatment options, and is a complaint that carries with it a high risk of complications and morbidity.  The differential diagnosis includes  stuttering priapism, ischemic priapism, nonischemic priapism, drug-induced priapism  MDM: Patient seen emergency room for evaluation of priapism.  Physical exam with a painful erection but is otherwise unremarkable.  A ring block was performed with 2% lidocaine and patient pain controlled.  I spoke with the urologist on-call Dr. Annabell Howells who is recommending to proceed with corpus cavernosum irrigation and detumescence with phenylephrine injection.  This did successfully lead to detumescence.  Dr. Annabell Howells states that he will send a message to his scheduler for urgent follow-up, as the patient has had multiple episodes over the last 4 weeks and desperately needs outpatient follow-up patient then discharged with outpatient urology follow-up   Additional history obtained:  -External records from outside source obtained and reviewed including: Chart review including previous notes, labs, imaging, consultation notes    Medicines ordered and prescription drug management: No orders of the defined types were placed in this encounter.   -I have reviewed the patients home medicines and have made adjustments as needed  Critical interventions none  Consultations Obtained: I requested consultation with the urologist on-call Dr. Annabell Howells,  and discussed lab and imaging findings as well as pertinent plan - they recommend: Detumescence with phenylephrine and outpatient follow-up   Cardiac Monitoring: The patient was maintained on a cardiac monitor.  I personally viewed and interpreted the cardiac monitored which showed an underlying rhythm of: NSR  Social Determinants of Health:  Factors impacting patients care include: Significant difficulty getting outpatient urology follow-up   Reevaluation: After the interventions noted above, I reevaluated the patient and found that they have :improved  Co morbidities that complicate the patient evaluation  Past Medical History:  Diagnosis Date   Sickle cell trait  (HCC)    Stuttering priapism       Dispostion: I considered admission for this patient, but at this time with symptoms improved patient safe for discharge with outpatient follow-up     Final Clinical Impression(s) / ED Diagnoses Final diagnoses:  None     @PCDICTATION @    Glendora Score, MD 01/23/23 1749

## 2023-01-23 NOTE — Discharge Instructions (Signed)
You were seen in the emergency room for evaluation of priapism.  You been seen in the emergency room multiple times and we spoke with urology about this who is recommending that you call the office and request ED urgent follow-up.  Please use these words when calling the office: " I have been seen multiple times in the emergency department for preop is him and the ER physician spoke with Dr. Annabell Howells who told me to call and ask for urgent ED outpatient follow-up this week".   At this time, you are safe for discharge but return to emergency room if your priapism returns

## 2023-01-23 NOTE — ED Triage Notes (Signed)
Arrived with priapism started this am around 7 am. Pt has been seen several times for same.

## 2023-01-23 NOTE — ED Notes (Signed)
Provider at beside with this nurse. Provider explained lidocaine will be injected with desired effect of numbing. member consents. Provider injected lidocaine into penal shaft. Patient tolerated well.

## 2023-01-31 ENCOUNTER — Emergency Department (HOSPITAL_COMMUNITY)
Admission: EM | Admit: 2023-01-31 | Discharge: 2023-01-31 | Disposition: A | Discharge status: Home / Self Care | Payer: Self-pay | Attending: Emergency Medicine | Admitting: Emergency Medicine

## 2023-01-31 DIAGNOSIS — N4830 Priapism, unspecified: Secondary | ICD-10-CM | POA: Insufficient documentation

## 2023-01-31 MED ORDER — PHENYLEPHRINE 200 MCG/ML FOR PRIAPISM / HYPOTENSION
200.0000 ug | Freq: Once | INTRAMUSCULAR | Status: AC
  Administered 2023-01-31: 200 ug via INTRACAVERNOUS
  Filled 2023-01-31: qty 50

## 2023-01-31 MED ORDER — HYDROMORPHONE HCL 1 MG/ML IJ SOLN
1.0000 mg | Freq: Once | INTRAMUSCULAR | Status: AC
  Administered 2023-01-31: 1 mg via INTRAVENOUS
  Filled 2023-01-31: qty 1

## 2023-01-31 MED ORDER — LIDOCAINE HCL 2 % IJ SOLN
100.0000 mg | Freq: Once | INTRAMUSCULAR | Status: AC
  Administered 2023-01-31: 100 mg
  Filled 2023-01-31: qty 20

## 2023-01-31 MED ORDER — ONDANSETRON HCL 4 MG/2ML IJ SOLN
4.0000 mg | Freq: Once | INTRAMUSCULAR | Status: AC
  Administered 2023-01-31: 4 mg via INTRAVENOUS
  Filled 2023-01-31: qty 2

## 2023-01-31 NOTE — ED Triage Notes (Signed)
Pt arrived via POV. C/o episode of pripism that began at 0530 this morning. Pt has hx stuttering priapism and has been seen mult time for same complaint.

## 2023-01-31 NOTE — ED Provider Notes (Signed)
[place] EMERGENCY DEPARTMENT AT [facility] Provider Note   CSN: [id number] Arrival date & time: 01/31/23  4098     History  Chief Complaint  Patient presents with   priapism    Gabriel Thomas is a 37 y.o. male with a PMHx of stuttering priapism who presents to the ED with concerns for priapism onset 5:30 AM.  Notes that he has tried multiple times to get in with urology however has not been able to do so.  Patient reports that he has an appointment with Dr. Alycia Rossetti scheduled for 02/04/2023.  Notes that he has been taking some of the finasteride however took 1 dose of the Cialis and stopped taking it.  Denies sexual intercourse prior to the onset of his symptoms.  Denies recent medication use.  Denies abdominal pain, nausea, vomiting, penile discharge, testicular pain/swelling.   Per patient chart review: Patient has a follow-up appointment with urology on 05/27/2023 with [facility].  Patient is been evaluated multiple times for similar concerns in the emergency department.  The history is provided by the patient. No language interpreter was used.       Home Medications Prior to Admission medications   Medication Sig Start Date End Date Taking? Authorizing Provider  finasteride (PROSCAR) 5 MG tablet Take 1 tablet (5 mg total) by mouth daily. 01/06/23   Linwood Dibbles, MD  oxyCODONE-acetaminophen (PERCOCET) 5-325 MG tablet Take 1 tablet by mouth every 4 (four) hours as needed. Patient not taking: Reported on 01/17/2023 09/05/19   Mesner, Barbara Cower, MD  tadalafil (CIALIS) 5 MG tablet Take 1 tablet (5 mg total) by mouth daily. For prevention priapism 01/06/23 02/05/23  Linwood Dibbles, MD  azithromycin (ZITHROMAX Z-PAK) 250 MG tablet As dirrected 02/23/12 09/05/19  Plotnikov, Georgina Quint, MD      Allergies    Aspirin, Primaquine, and Sulfa antibiotics    Review of Systems   Review of Systems  All other systems reviewed and are negative.   Physical Exam Updated Vital Signs BP (!) 130/95   Pulse  72   Temp 97.7 F (36.5 C) (Oral)   Resp 20   Ht 5\' 7"  (1.702 m)   Wt 79.4 kg   SpO2 100%   BMI 27.42 kg/m  Physical Exam Vitals and nursing note reviewed. Exam conducted with a chaperone present.  Constitutional:      General: He is not in acute distress.    Appearance: He is not diaphoretic.  HENT:     Head: Normocephalic and atraumatic.     Mouth/Throat:     Pharynx: No oropharyngeal exudate.  Eyes:     General: No scleral icterus.    Conjunctiva/sclera: Conjunctivae normal.  Cardiovascular:     Rate and Rhythm: Normal rate and regular rhythm.     Pulses: Normal pulses.     Heart sounds: Normal heart sounds.  Pulmonary:     Effort: Pulmonary effort is normal. No respiratory distress.     Breath sounds: Normal breath sounds. No wheezing.  Abdominal:     General: Bowel sounds are normal.     Palpations: Abdomen is soft. There is no mass.     Tenderness: There is no abdominal tenderness. There is no guarding or rebound.  Genitourinary:    Penis: Circumcised.      Comments: RN chaperone Dalia Heading) present for GU exam. Erect penis noted. No TTP noted to bilateral testes. No penile discharge noted.  Musculoskeletal:        General: Normal range  of motion.     Cervical back: Normal range of motion and neck supple.  Skin:    General: Skin is warm and dry.  Neurological:     Mental Status: He is alert.  Psychiatric:        Behavior: Behavior normal.     ED Results / Procedures / Treatments   Labs (all labs ordered are listed, but only abnormal results are displayed) Labs Reviewed - No data to display  EKG None  Radiology No results found.  Procedures Procedures    Medications Ordered in ED Medications  HYDROmorphone (DILAUDID) injection 1 mg (1 mg Intravenous Given 01/31/23 0931)  phenylephrine 200 mcg / ml CONC. DILUTION INJ (ED / Urology USE ONLY) (200 mcg Intracavernosal Given 01/31/23 1030)  ondansetron (ZOFRAN) injection 4 mg (4 mg Intravenous Given  01/31/23 1007)  lidocaine (XYLOCAINE) 2 % (with pres) injection 100 mg (100 mg Infiltration Given 01/31/23 1030)    ED Course/ Medical Decision Making/ A&P Clinical Course as of 01/31/23 1235  Sun Jan 31, 2023  0930 Consult with urology, Dr. Delanna Ahmadi who is requesting takedown to be completed in the ED and will evaluate the patient in the ED.  [SB]  1217 Patient reevaluated and noted improvement of his prior presumed in the emergency department.  Discussed with patient discharge treatment plan.  Answered all verbal questions.  Patient for safe discharge at this time. [SB]    Clinical Course User Index [SB] Seleta Hovland A, PA-C                             Medical Decision Making Risk Prescription drug management.   Pt presents with concerns for priapism onset 5:30 AM this morning. History of similar symptoms. No meds tried PTA. Vital signs, pt afebrile. On exam, pt with RN chaperone Dalia Heading) present for GU exam. Erect penis noted. No TTP noted to bilateral testes. No penile discharge noted. No acute cardiovascular, respiratory, abdominal exam findings. Differential diagnosis includes priapism, foreign body, peyronies disease.    Additional history obtained:  External records from outside source obtained and reviewed including: Patient has a follow-up appointment with urology on 05/27/2023 with [facility].  Patient is been evaluated multiple times for similar concerns in the emergency department.   Medications:  I ordered medication including dialudid for pain management Reevaluation of the patient after these medicines and interventions, I reevaluated the patient and found that they have improved I have reviewed the patients home medicines and have made adjustments as needed   Consultations: I requested consultation with the Urology Resident, Dr. Delanna Ahmadi and discussed lab and imaging findings as well as pertinent plan - they recommend: takedown completed in the ED and will evaluate  patient in the ED.     Disposition: Presentation suspicious for priapism. Doubt at this time concerns for foreign body to urethra or peyronie's disease. Dorsal nerve block performed by Attending at bedside, see Attendings note for procedure note. Pt with resolution of erection in ED after initial dorsal nerve block performed by Attending. Phenylephrine not utilized today in the ED. After consideration of the diagnostic results and the patients response to treatment, I feel that the patient would benefit from Discharge home. Maintain scheduled follow up appointment with Urology as scheduled for 02/04/23. Pt provided with information for Sickle cell clinic for follow up. Supportive care measures and strict return precautions discussed with patient at bedside. Pt acknowledges and verbalizes understanding. Pt appears  safe for discharge. Follow up as indicated in discharge paperwork.    This chart was dictated using voice recognition software, Dragon. Despite the best efforts of this provider to proofread and correct errors, errors may still occur which can change documentation meaning.   Final Clinical Impression(s) / ED Diagnoses Final diagnoses:  Priapism    Rx / DC Orders ED Discharge Orders     None         Emerick Weatherly A, PA-C 01/31/23 1235    Wynetta Fines, MD 01/31/23 1536

## 2023-01-31 NOTE — ED Provider Notes (Signed)
  [place] EMERGENCY DEPARTMENT AT [facility] Provider Procedure Note   Procedures .Nerve Block  Date/Time: 01/31/2023 11:52 AM  Performed by: Wynetta Fines, MD Authorized by: Wynetta Fines, MD   Consent:    Consent obtained:  Verbal   Consent given by:  Patient   Risks, benefits, and alternatives were discussed: yes     Risks discussed:  Allergic reaction, bleeding, intravenous injection, infection, nerve damage, swelling, unsuccessful block and pain   Alternatives discussed:  No treatment Universal protocol:    Immediately prior to procedure, a time out was called: yes     Patient identity confirmed:  Verbally with patient Indications:    Indications:  Pain relief and procedural anesthesia Location:    Nerve block body site: dorsal penile nerve block. Pre-procedure details:    Skin preparation:  Povidone-iodine   Preparation: Patient was prepped and draped in usual sterile fashion   Skin anesthesia:    Skin anesthesia method:  Local infiltration   Local anesthetic:  Lidocaine 1% w/o epi Procedure details:    Block needle gauge:  25 G   Anesthetic injected:  Lidocaine 1% w/o epi Post-procedure details:    Dressing:  None   Outcome:  Pain relieved Comments:     Dorsal penile nerve block administered.  1% lidocaine without epi injected without difficulty.  Patient tolerated well.  Patient reported decreased pain after nerve block.  Remarkably patient's priapism began to resolve after nerve block.  Patient declined phenylephrine injection given detumescence of priapism.     Medications Ordered in ED Medications  phenylephrine 200 mcg / ml CONC. DILUTION INJ (ED / Urology USE ONLY) (has no administration in time range)  lidocaine (XYLOCAINE) 2 % (with pres) injection 100 mg (has no administration in time range)  HYDROmorphone (DILAUDID) injection 1 mg (1 mg Intravenous Given 01/31/23 0931)  ondansetron (ZOFRAN) injection 4 mg (4 mg Intravenous Given  01/31/23 1007)    ED Course/ Medical Decision Making/ A&P Clinical Course as of 01/31/23 1152  Sun Jan 31, 2023  0930 Consult with urology [SB]    Clinical Course User Index [SB] Wilson, Soijett A, PA-C   {     Wynetta Fines, MD 01/31/23 1154

## 2023-01-31 NOTE — ED Notes (Signed)
Pt d/c. Discharge paper and follow up info given. Verbalized understanding. NAD. Stable condition at time of d/c

## 2023-01-31 NOTE — Discharge Instructions (Addendum)
You will be given a referral to the Sickle cell clinic, call and set up a follow up appointment regarding todays ED visit. Maintain your scheduled follow up appointment with Urology as scheduled for 02/04/23. Return to the ED if you are experiencing increasing/worsening symptoms.

## 2023-01-31 NOTE — Consult Note (Signed)
Urology Consult   Physician requesting consult: Kristine Royal, MD  Reason for consult: priapism  History of Present Illness: Gabriel Thomas is a 37 y.o. male with a PMH of sickle cell trait and stuttering priapism who presented to the ED with erection x3.5hrs. He has multiple presentations over the last month for similar issue, including on 5/4, 4/28, 4/17, 4/16. He has been managed with phenylephrine injections with relative success at these visits.  He was scheduled for follow-up with [facility] and has an appt this coming Thursday. He was instructed to start finasteride and cialis for prevention of priapism episodes but was hesitant re: side-effects, primarily related to finasteride, and has not been taking.  This most recent episode began around 5am. Patient reports painful erection that would not subside. He came to the ED for evaluation. In the ED, patient was prepped for a phenylephrine injection but spontaneously detumesced following a penile nerve block. His pain resolved and his corpora were compressible.     Past Medical History:  Diagnosis Date   Sickle cell trait (HCC)    Stuttering priapism     Past Surgical History:  Procedure Laterality Date   ORIF FINGER / THUMB FRACTURE      Current Hospital Medications:  Home Meds:  No current facility-administered medications on file prior to encounter.   Current Outpatient Medications on File Prior to Encounter  Medication Sig Dispense Refill   finasteride (PROSCAR) 5 MG tablet Take 1 tablet (5 mg total) by mouth daily. 30 tablet 0   oxyCODONE-acetaminophen (PERCOCET) 5-325 MG tablet Take 1 tablet by mouth every 4 (four) hours as needed. (Patient not taking: Reported on 01/17/2023) 20 tablet 0   tadalafil (CIALIS) 5 MG tablet Take 1 tablet (5 mg total) by mouth daily. For prevention priapism 30 tablet 0   [DISCONTINUED] azithromycin (ZITHROMAX Z-PAK) 250 MG tablet As dirrected 6 each 0     Scheduled Meds: Continuous Infusions: PRN  Meds:.  Allergies:  Allergies  Allergen Reactions   Aspirin Other (See Comments)   Primaquine Other (See Comments)   Sulfa Antibiotics Other (See Comments)    Family History  Problem Relation Age of Onset   Arthritis Mother    Diabetes Mother    Hypertension Mother     Social History:  reports that he has quit smoking. He has never used smokeless tobacco. He reports current alcohol use. He reports that he does not use drugs.  ROS: A complete review of systems was performed.  All systems are negative except for pertinent findings as noted.  Physical Exam:  Vital signs in last 24 hours: Temp:  [97.7 F (36.5 C)-98.2 F (36.8 C)] 98.2 F (36.8 C) (05/12 1243) Pulse Rate:  [66-72] 66 (05/12 1243) Resp:  [18-20] 18 (05/12 1243) BP: (130-131)/(91-95) 131/91 (05/12 1243) SpO2:  [99 %-100 %] 99 % (05/12 1243) Weight:  [79.4 kg] 79.4 kg (05/12 0832) Constitutional:  Alert and oriented, No acute distress Cardiovascular: Regular rate and rhythm, No JVD Respiratory: Normal respiratory effort, Lungs clear bilaterally GI: Abdomen is soft, nontender, nondistended, no abdominal masses GU: Mostly detumesced penile shaft with compressible corpora bilaterally. No pain to palpation of bilateral corpora.  Lymphatic: No lymphadenopathy Neurologic: Grossly intact, no focal deficits Psychiatric: Normal mood and affect  Laboratory Data:  No results for input(s): "WBC", "HGB", "HCT", "PLT" in the last 72 hours.  No results for input(s): "NA", "K", "CL", "GLUCOSE", "BUN", "CALCIUM", "CREATININE" in the last 72 hours.  Invalid input(s): "CO3"   No  results found for this or any previous visit (from the past 24 hour(s)). No results found for this or any previous visit (from the past 240 hour(s)).  Renal Function: No results for input(s): "CREATININE" in the last 168 hours. CrCl cannot be calculated (Patient's most recent lab result is older than the maximum 21 days allowed.).  Radiologic  Imaging: No results found.  I independently reviewed the above imaging studies.  Impression/Recommendation #Stuttering priapism - Resolved spontaneously without phenylephrine around 5.5hrs - Discussed preventative measures including finasteride/cialis, firmagon injection, ketoconozole for androgen deprivation/blockade, as well as the side-effects including but not limited to decreased libido. Went over these options at length and ultimately patient remained hesitant about altering his testosterone levels. He has opted to defer these measures at present and will think over his options prior to his appt with [facility] this Thursday, 5/16 - Sickle cell clinic referral - Return precautions discussed  Gabriel Thomas 01/31/2023, 1:17 PM

## 2023-02-02 ENCOUNTER — Emergency Department (HOSPITAL_COMMUNITY)
Admission: EM | Admit: 2023-02-02 | Discharge: 2023-02-02 | Disposition: A | Discharge status: Home / Self Care | Payer: Self-pay | Attending: Emergency Medicine | Admitting: Emergency Medicine

## 2023-02-02 DIAGNOSIS — N4830 Priapism, unspecified: Secondary | ICD-10-CM | POA: Insufficient documentation

## 2023-02-02 MED ORDER — HYDROMORPHONE HCL 1 MG/ML IJ SOLN
0.5000 mg | Freq: Once | INTRAMUSCULAR | Status: AC
  Administered 2023-02-02: 0.5 mg via INTRAVENOUS
  Filled 2023-02-02: qty 1

## 2023-02-02 MED ORDER — PHENYLEPHRINE 200 MCG/ML FOR PRIAPISM / HYPOTENSION
200.0000 ug | Freq: Once | INTRAMUSCULAR | Status: AC
  Administered 2023-02-02: 200 ug via INTRACAVERNOUS
  Filled 2023-02-02: qty 50

## 2023-02-02 MED ORDER — OXYCODONE-ACETAMINOPHEN 5-325 MG PO TABS
2.0000 | ORAL_TABLET | Freq: Once | ORAL | Status: AC
  Administered 2023-02-02: 2 via ORAL
  Filled 2023-02-02: qty 2

## 2023-02-02 MED ORDER — HYDROMORPHONE HCL 1 MG/ML IJ SOLN
1.0000 mg | Freq: Once | INTRAMUSCULAR | Status: AC
  Administered 2023-02-02: 1 mg via INTRAVENOUS
  Filled 2023-02-02: qty 1

## 2023-02-02 MED ORDER — LIDOCAINE HCL 2 % IJ SOLN
10.0000 mL | Freq: Once | INTRAMUSCULAR | Status: AC
  Administered 2023-02-02: 200 mg
  Filled 2023-02-02: qty 20

## 2023-02-02 NOTE — ED Provider Notes (Signed)
[place] EMERGENCY DEPARTMENT AT [facility] Provider Note   CSN: [id number] Arrival date & time: 02/02/23  1159     History No chief complaint on file.   Gabriel Thomas is a 37 y.o. male.  Patient with a past history significant for priapism presents emergency department for priapism.  He reports that he began to experience an erection starting at about 8:30 AM this morning and the erection has been persistent since then.  History of sickle cell trait.  He states that he has an appointment scheduled with urology for evaluation in 2 days but was unable to get the erection to resolve on its own at home.  Previously has had success with dorsal penile nerve block as well as phenylephrine injection.  Denies sexual intercourse prior to onset of symptoms or use of any kind of erectile dysfunction medication such as Cialis.  Reporting significant pain from prolonged erection.  HPI     Home Medications Prior to Admission medications   Medication Sig Start Date End Date Taking? Authorizing Provider  finasteride (PROSCAR) 5 MG tablet Take 1 tablet (5 mg total) by mouth daily. 01/06/23   Linwood Dibbles, MD  oxyCODONE-acetaminophen (PERCOCET) 5-325 MG tablet Take 1 tablet by mouth every 4 (four) hours as needed. Patient not taking: Reported on 01/17/2023 09/05/19   Mesner, Barbara Cower, MD  tadalafil (CIALIS) 5 MG tablet Take 1 tablet (5 mg total) by mouth daily. For prevention priapism 01/06/23 02/05/23  Linwood Dibbles, MD  azithromycin (ZITHROMAX Z-PAK) 250 MG tablet As dirrected 02/23/12 09/05/19  Plotnikov, Georgina Quint, MD      Allergies    Aspirin, Primaquine, and Sulfa antibiotics    Review of Systems   Review of Systems  Genitourinary:  Positive for penile pain and penile swelling.  All other systems reviewed and are negative.   Physical Exam Updated Vital Signs BP (!) 142/88 (BP Location: Left Arm)   Pulse 62   Temp 98.2 F (36.8 C) (Oral)   Resp 16   SpO2 99%  Physical Exam Vitals and  nursing note reviewed.  Constitutional:      General: He is not in acute distress.    Appearance: He is well-developed.  HENT:     Head: Normocephalic and atraumatic.  Eyes:     Conjunctiva/sclera: Conjunctivae normal.  Cardiovascular:     Rate and Rhythm: Normal rate and regular rhythm.     Heart sounds: No murmur heard. Pulmonary:     Effort: Pulmonary effort is normal. No respiratory distress.     Breath sounds: Normal breath sounds.  Abdominal:     Palpations: Abdomen is soft.     Tenderness: There is no abdominal tenderness.  Genitourinary:    Comments: Rigid penile shaft with flaccid glans Musculoskeletal:        General: No swelling.     Cervical back: Neck supple.  Skin:    General: Skin is warm and dry.     Capillary Refill: Capillary refill takes less than 2 seconds.  Neurological:     Mental Status: He is alert.  Psychiatric:        Mood and Affect: Mood normal.     ED Results / Procedures / Treatments   Labs (all labs ordered are listed, but only abnormal results are displayed) Labs Reviewed - No data to display  EKG None  Radiology No results found.  Procedures Irrigate corpus cavern, priapism  Date/Time: 02/02/2023 5:02 PM  Performed by: Smitty Knudsen, PA-C Authorized  by: Smitty Knudsen, PA-C  Consent: Verbal consent obtained. Risks and benefits: risks, benefits and alternatives were discussed Consent given by: patient Patient understanding: patient states understanding of the procedure being performed Patient consent: the patient's understanding of the procedure matches consent given Procedure consent: procedure consent matches procedure scheduled Patient identity confirmed: verbally with patient Time out: Immediately prior to procedure a "time out" was called to verify the correct patient, procedure, equipment, support staff and site/side marked as required. Preparation: Patient was prepped and draped in the usual sterile fashion. Local  anesthesia used: yes Anesthesia: nerve block  Anesthesia: Local anesthesia used: yes Local Anesthetic: lidocaine 2% without epinephrine Anesthetic total: 4 mL  Sedation: Patient sedated: no  Patient tolerance: patient tolerated the procedure well with no immediate complications Comments: phenylephrine administered via intracavernosal injection      Medications Ordered in ED Medications  oxyCODONE-acetaminophen (PERCOCET/ROXICET) 5-325 MG per tablet 2 tablet (2 tablets Oral Given 02/02/23 1303)  lidocaine (XYLOCAINE) 2 % (with pres) injection 200 mg (200 mg Infiltration Given 02/02/23 1336)  HYDROmorphone (DILAUDID) injection 1 mg (1 mg Intravenous Given 02/02/23 1401)  phenylephrine 200 mcg / ml CONC. DILUTION INJ (ED / Urology USE ONLY) (200 mcg Intracavernosal Given 02/02/23 1534)  HYDROmorphone (DILAUDID) injection 0.5 mg (0.5 mg Intravenous Given 02/02/23 1547)    ED Course/ Medical Decision Making/ A&P                           Medical Decision Making  This patient presents to the ED for concern of priapism.  Differential diagnosis includes medication induced priapism, drug-induced priapism, foreign body    Medicines ordered and prescription drug management:  I ordered medication including Percocet, Dilaudid, phenylephrine for pain, vasoconstriction in setting of priapism Reevaluation of the patient after these medicines showed that the patient improved I have reviewed the patients home medicines and have made adjustments as needed   Problem List / ED Course:  Patient presents to the emergency department with concerns of priapism. Has been seen multiple times in the past few weeks for similar concerns. States that he is seeing Dr. Annabell Howells with [facility] in 2 days for evaluation. Prior to onset of priapism a few weeks ago, patient denies previously having this occur. Denied any medication use, arousal, or masturbation prior to onset of symptoms.  Patient's  penile shaft was rigid and glans soft consistent with low flow priapism.  Does believe that he was waking up around the time the erection started. Known sickle cell trait but not diagnosed with sickle cell anemia. Unclear if this may be contributing to patient's symptoms.  Patient does not currently have a primary care provider but was previously given hematology contact information for follow-up for evaluation of possible hematologic disorder that may be causing his episodes. Penile nerve block performed which provided anesthetic relief of symptoms, but prolonged tumescence not achieved until dose of phenylephrine was injected injected intracavernosal.  Patient tolerated without significant applications.  Patient has follow-up with Dr. Annabell Howells scheduled for 2 days from now and advised patient he should keep this appointment as this is somewhat concerned that he keeps having repeat cases priapism occurring.   Reevaluation of patient appears to have shown the patient's condition significantly improved and resolving. Advised patient return the emergency department if symptoms return or worsen.  Patient is agreeable to treatment plan verbalized understanding all return precautions.   Social Determinants of Health:  Does not have  a PCP  Final Clinical Impression(s) / ED Diagnoses Final diagnoses:  Priapism    Rx / DC Orders ED Discharge Orders     None         Smitty Knudsen, PA-C 02/02/23 1713    Melene Plan, DO 02/08/23 1455

## 2023-02-02 NOTE — ED Notes (Signed)
Pt d/c with instructions. All questions answered. NAD. Stable condition at time of d/c 

## 2023-02-02 NOTE — Discharge Instructions (Signed)
You were seen in the emergency department for priapism. Thankfully treatment appears to have improved your condition. Please follow up with your urologist for further evaluation since you appear to have repeated recurrence of this. If your symptoms worsen, please return to the emergency department for further evaluation and treatment.

## 2023-02-02 NOTE — ED Triage Notes (Signed)
stuttering priapism since this morning. Does have an appointment scheduled with urology

## 2023-02-04 ENCOUNTER — Emergency Department (HOSPITAL_COMMUNITY)
Admission: EM | Admit: 2023-02-04 | Discharge: 2023-02-04 | Disposition: A | Discharge status: Home / Self Care | Payer: Self-pay | Attending: Emergency Medicine | Admitting: Emergency Medicine

## 2023-02-04 ENCOUNTER — Encounter (HOSPITAL_COMMUNITY): Payer: Self-pay

## 2023-02-04 DIAGNOSIS — N4830 Priapism, unspecified: Secondary | ICD-10-CM | POA: Insufficient documentation

## 2023-02-04 MED ORDER — LIDOCAINE HCL (PF) 1 % IJ SOLN
10.0000 mL | Freq: Once | INTRAMUSCULAR | Status: AC
  Administered 2023-02-04: 10 mL
  Filled 2023-02-04: qty 30

## 2023-02-04 MED ORDER — HYDROMORPHONE HCL 1 MG/ML IJ SOLN
1.0000 mg | Freq: Once | INTRAMUSCULAR | Status: AC
  Administered 2023-02-04: 1 mg via INTRAVENOUS
  Filled 2023-02-04: qty 1

## 2023-02-04 MED ORDER — HYDROMORPHONE HCL 1 MG/ML IJ SOLN
0.5000 mg | Freq: Once | INTRAMUSCULAR | Status: AC
  Administered 2023-02-04: 0.5 mg via INTRAVENOUS
  Filled 2023-02-04: qty 1

## 2023-02-04 MED ORDER — PHENYLEPHRINE 200 MCG/ML FOR PRIAPISM / HYPOTENSION
50.0000 ug | Freq: Once | INTRAMUSCULAR | Status: AC
  Administered 2023-02-04: 50 ug via INTRACAVERNOUS
  Filled 2023-02-04: qty 50

## 2023-02-04 NOTE — Discharge Instructions (Signed)
Please keep your appointment today with Urology. Return to the ED with any erection that does not resolve without 4 hours.

## 2023-02-04 NOTE — ED Triage Notes (Signed)
Pt presents with c/o priapism that started this morning at 5 am. Pt reports an appointment is scheduled with urology today at 1 pm.

## 2023-02-04 NOTE — ED Notes (Signed)
ED Provider at bedside. Administered block at this time.

## 2023-02-04 NOTE — ED Provider Notes (Signed)
Emergency Department Provider Note   I have reviewed the triage vital signs and the nursing notes.   HISTORY  Chief Complaint Priaprism   HPI Yeray Boldon is a 37 y.o. male with PMH of priapism and sickle cell trait presents to the ED with recurrent priapism. He has required priapism treatment in the ED 4-5 times. Symptoms today began at [facility]. He has been seen in the ED 2 days prior with relief after nerve block and phenylepherine injection. He has a Urology appointment at 1PM today. Denies drug use.    Past Medical History:  Diagnosis Date   Sickle cell trait (HCC)    Stuttering priapism     Review of Systems  Constitutional: No fever/chills Cardiovascular: Denies chest pain. Respiratory: Denies shortness of breath. Gastrointestinal: No abdominal pain.  No nausea, no vomiting. Genitourinary: Negative for dysuria. Positive priapism.  Musculoskeletal: Negative for back pain. Skin: Negative for rash. Neurological: Negative for headaches.   ____________________________________________   PHYSICAL EXAM:  VITAL SIGNS: ED Triage Vitals  Enc Vitals Group     BP 02/04/23 0939 137/84     Pulse Rate 02/04/23 0939 80     Resp 02/04/23 0939 18     Temp 02/04/23 0939 97.6 F (36.4 C)     Temp Source 02/04/23 0939 Oral     SpO2 02/04/23 0939 99 %   Constitutional: Alert and oriented. Well appearing and in no acute distress. Eyes: Conjunctivae are normal.  Head: Atraumatic. Nose: No congestion/rhinnorhea. Mouth/Throat: Mucous membranes are moist. Neck: No stridor.   Cardiovascular: Normal rate, regular rhythm. Good peripheral circulation. Grossly normal heart sounds.   Respiratory: Normal respiratory effort.  No retractions. Lungs CTAB. Gastrointestinal: Soft and nontender. No distention.  Genitourinary: Patient with erect penis without visual evidence of ischemia to the glans.  Musculoskeletal: No lower extremity tenderness nor edema. No gross deformities of  extremities. Neurologic:  Normal speech and language. Skin:  Skin is warm, dry and intact. No rash noted.   ____________________________________________   PROCEDURES  Procedure(s) performed:   Irrigate corpus cavern, priapism  Date/Time: 02/04/2023 10:21 AM  Performed by: Maia Plan, MD Authorized by: Maia Plan, MD  Consent: Verbal consent obtained. Risks and benefits: risks, benefits and alternatives were discussed Consent given by: patient Patient identity confirmed: verbally with patient Preparation: Patient was prepped and draped in the usual sterile fashion. Local anesthesia used: yes Anesthesia: nerve block  Anesthesia: Local anesthesia used: yes Local Anesthetic: lidocaine 1% without epinephrine Anesthetic total: 10 mL  Sedation: Patient sedated: no  Patient tolerance: patient tolerated the procedure well with no immediate complications Comments: After achieving anesthesia with dorsal penile block, 100 mcg of phenylephrine was injected laterally into the corpus on each side.  Patient tolerated well.  Coban pressure dressing applied. No immediate complications.     ____________________________________________   INITIAL IMPRESSION / ASSESSMENT AND PLAN / ED COURSE  Pertinent labs & imaging results that were available during my care of the patient were reviewed by me and considered in my medical decision making (see chart for details).   This patient is Presenting for Evaluation of priapism, which does require a range of treatment options, and is a complaint that involves a high risk of morbidity and mortality.  The Differential Diagnoses include low flow priapism, high flow priapism, ischemia, polysubstance abuse, etc.   Critical Interventions-    Medications  phenylephrine 200 mcg / ml CONC. DILUTION INJ (ED / Urology USE ONLY) (has no administration in  time range)  HYDROmorphone (DILAUDID) injection 0.5 mg (has no administration in time range)   lidocaine (PF) (XYLOCAINE) 1 % injection 10 mL (10 mLs Infiltration Given by Other 02/04/23 1017)  HYDROmorphone (DILAUDID) injection 1 mg (1 mg Intravenous Given 02/04/23 0957)    Reassessment after intervention: symptoms improved.    I decided to review pertinent External Data, and in summary similar presentation in the last 2 days.   Social Determinants of Health Risk denies drug abuse.    Medical Decision Making: Summary:  Patient presents to the ED with likely low-flow priapism.  After assessment, plan for penile nerve block and phenylephrine injection.  Discussed risk/benefit with patient who provides verbal consent for this procedure.   Reevaluation with update and discussion with patient.  We have achieved detumescence after the above procedure.  He has a urology appointment at 1 PM today which I encouraged him to keep. Discussed strict ED return precautions.   Patient's presentation is most consistent with acute presentation with potential threat to life or bodily function.   Disposition: discharge  ____________________________________________  FINAL CLINICAL IMPRESSION(S) / ED DIAGNOSES  Final diagnoses:  Priapism    Note:  This document was prepared using Dragon voice recognition software and may include unintentional dictation errors.  Alona Bene, MD, [facility] Emergency Medicine    Wille Aubuchon, Arlyss Repress, MD 02/04/23 1048

## 2023-02-06 ENCOUNTER — Encounter (HOSPITAL_COMMUNITY): Payer: Self-pay

## 2023-02-06 ENCOUNTER — Emergency Department (HOSPITAL_COMMUNITY)
Admission: EM | Admit: 2023-02-06 | Discharge: 2023-02-06 | Disposition: A | Discharge status: Home / Self Care | Payer: Self-pay | Attending: Emergency Medicine | Admitting: Emergency Medicine

## 2023-02-06 ENCOUNTER — Other Ambulatory Visit: Payer: Self-pay | Admitting: Urology

## 2023-02-06 ENCOUNTER — Other Ambulatory Visit: Payer: Self-pay

## 2023-02-06 DIAGNOSIS — N4830 Priapism, unspecified: Secondary | ICD-10-CM

## 2023-02-06 MED ORDER — PHENYLEPHRINE 200 MCG/ML FOR PRIAPISM / HYPOTENSION
200.0000 ug | Freq: Once | INTRAMUSCULAR | Status: AC
  Administered 2023-02-06: 200 ug via INTRACAVERNOUS
  Filled 2023-02-06: qty 50

## 2023-02-06 MED ORDER — HYDROMORPHONE HCL 1 MG/ML IJ SOLN
0.5000 mg | Freq: Once | INTRAMUSCULAR | Status: AC
  Administered 2023-02-06: 0.5 mg via INTRAVENOUS
  Filled 2023-02-06: qty 1

## 2023-02-06 MED ORDER — FINASTERIDE 5 MG PO TABS: 5.0000 mg | ORAL_TABLET | Freq: Every day | ORAL | 1 refills | Status: AC

## 2023-02-06 MED ORDER — LIDOCAINE HCL (PF) 1 % IJ SOLN
30.0000 mL | Freq: Once | INTRAMUSCULAR | Status: AC
  Administered 2023-02-06: 30 mL
  Filled 2023-02-06: qty 30

## 2023-02-06 MED ORDER — ONDANSETRON HCL 4 MG/2ML IJ SOLN
4.0000 mg | Freq: Once | INTRAMUSCULAR | Status: AC
  Administered 2023-02-06: 4 mg via INTRAVENOUS
  Filled 2023-02-06: qty 2

## 2023-02-06 MED ORDER — SODIUM CHLORIDE 0.9 % IV BOLUS
1000.0000 mL | Freq: Once | INTRAVENOUS | Status: AC
  Administered 2023-02-06: 1000 mL via INTRAVENOUS

## 2023-02-06 MED ORDER — TADALAFIL 5 MG PO TABS: 5.0000 mg | ORAL_TABLET | Freq: Every day | ORAL | 1 refills | Status: AC

## 2023-02-06 MED ORDER — LORAZEPAM 2 MG/ML IJ SOLN
0.5000 mg | Freq: Once | INTRAMUSCULAR | Status: AC
  Administered 2023-02-06: 0.5 mg via INTRAVENOUS
  Filled 2023-02-06: qty 1

## 2023-02-06 MED ORDER — HYDROMORPHONE HCL 1 MG/ML IJ SOLN
1.0000 mg | Freq: Once | INTRAMUSCULAR | Status: AC
  Administered 2023-02-06: 1 mg via INTRAVENOUS
  Filled 2023-02-06: qty 1

## 2023-02-06 NOTE — ED Notes (Addendum)
Additional phenylephrine 214mcg/mL given at 1020a by Pete Glatter MD

## 2023-02-06 NOTE — Consult Note (Signed)
Urology Consult   Physician requesting consult: Bethann Berkshire, MD  Reason for consult: Priapism  History of Present Illness: Gabriel Thomas is a 37 y.o. male with a PMH of sickle cell trait and stuttering priapism who presented to the ED with erection x 4 hrs. He has multiple presentations over the last month for similar issue, including on 5/16, 5/14, 5/4, 4/28, 4/17, 4/16. He has been managed with phenylephrine injections with relative success at these visits. He was previously instructed to start daily finasteride and cialis for prevention of priapism episodes but was hesitant re: side-effects, primarily related to finasteride.   This most recent episode began around 3 AM today. Patient reports painful erection that would not subside. He came to the ED for evaluation. He received 200 mcg of phenylephrine around 8 AM with partial detumescence.   Past Medical History:  Diagnosis Date   Sickle cell trait (HCC)    Stuttering priapism     Past Surgical History:  Procedure Laterality Date   ORIF FINGER / THUMB FRACTURE      Medications:  Scheduled Meds: Continuous Infusions: PRN Meds:.  Allergies:  Allergies  Allergen Reactions   Aspirin Other (See Comments)   Primaquine Other (See Comments)   Sulfa Antibiotics Other (See Comments)    Family History  Problem Relation Age of Onset   Arthritis Mother    Diabetes Mother    Hypertension Mother     Social History:  reports that he has quit smoking. He has never used smokeless tobacco. He reports current alcohol use. He reports that he does not use drugs.  ROS: A complete review of systems was performed.  All systems are negative except for pertinent findings as noted.  Physical Exam:  Vital signs in last 24 hours: Temp:  [98.2 F (36.8 C)] 98.2 F (36.8 C) (05/18 0702) Pulse Rate:  [60-72] 60 (05/18 1019) Resp:  [17-18] 18 (05/18 1019) BP: (111-162)/(77-133) 111/77 (05/18 1019) SpO2:  [97 %-99 %] 99 % (05/18  1019) GENERAL APPEARANCE:  Well appearing, well developed, well nourished, NAD HEENT:  Atraumatic, normocephalic, oropharynx clear NECK:  Supple without lymphadenopathy or thyromegaly ABDOMEN:  Soft, non-tender, no masses EXTREMITIES:  Moves all extremities well, without clubbing, cyanosis, or edema NEUROLOGIC:  Alert and oriented x 3, CN II-XII grossly intact MENTAL STATUS:  appropriate BACK:  Non-tender to palpation, No CVAT SKIN:  Warm, dry, and intact GU:  Partially erect penis; no pain to palpation   Laboratory Data:  No results for input(s): "WBC", "HGB", "HCT", "PLT" in the last 72 hours.  No results for input(s): "NA", "K", "CL", "GLUCOSE", "BUN", "CALCIUM", "CREATININE" in the last 72 hours.  Invalid input(s): "CO3"   No results found for this or any previous visit (from the past 24 hour(s)). No results found for this or any previous visit (from the past 240 hour(s)).  Renal Function: No results for input(s): "CREATININE" in the last 168 hours. CrCl cannot be calculated (Patient's most recent lab result is older than the maximum 21 days allowed.).  Radiologic Imaging: No results found.  I independently reviewed the above imaging studies.  Impression/Recommendation Stuttering priapism  Under sterile conditions, 200 mcg of phenylephrine injected into corporal body.  Patient tolerated well.  After 15 minutes, almost complete detumescence noted.  Recommend treatment with daily cialis and finasteride for prevention of priapism.  Rx sent to pharmacy. Risks and benefits of treatment reviewed with patient. Encouraged him to pursue evaluation at sickle cell clinic Follow-up with [facility]  to receive instruction on self injections   Di Kindle 02/06/2023, 10:29 AM

## 2023-02-06 NOTE — Discharge Instructions (Signed)
Take your medicine as prescribed by the urologist and follow-up as indicated by the urologist

## 2023-02-06 NOTE — ED Triage Notes (Signed)
Pt. Arrives pov c/o priaprism. Pt. States that he is currently being treated for it, but his prescription was not signed.

## 2023-02-06 NOTE — ED Provider Notes (Signed)
[place] EMERGENCY DEPARTMENT AT [facility] Provider Note   CSN: [id number] Arrival date & time: 02/06/23  2130     History  No chief complaint on file.   Gabriel Thomas is a 37 y.o. male.  Patient has a history of sickle cell trait.  Patient is here for priapism that started at 3:30 AM.  This has happened several times to him. this is the third time in the last 5 days  The history is provided by the patient and medical records. No language interpreter was used.  Groin Pain This is a new problem. The current episode started 3 to 5 hours ago. The problem occurs constantly. The problem has not changed since onset.Pertinent negatives include no chest pain, no abdominal pain and no headaches. Nothing aggravates the symptoms. Nothing relieves the symptoms. He has tried nothing for the symptoms. The treatment provided no relief.       Home Medications Prior to Admission medications   Medication Sig Start Date End Date Taking? Authorizing Provider  finasteride (PROSCAR) 5 MG tablet Take 1 tablet (5 mg total) by mouth daily. 02/06/23   Stoneking, Danford Bad., MD  oxyCODONE-acetaminophen (PERCOCET) 5-325 MG tablet Take 1 tablet by mouth every 4 (four) hours as needed. Patient not taking: Reported on 01/17/2023 09/05/19   Mesner, Barbara Cower, MD  tadalafil (CIALIS) 5 MG tablet Take 1 tablet (5 mg total) by mouth daily. For prevention priapism 02/06/23   Milderd Meager., MD  azithromycin (ZITHROMAX Z-PAK) 250 MG tablet As dirrected 02/23/12 09/05/19  Plotnikov, Georgina Quint, MD      Allergies    Aspirin, Primaquine, and Sulfa antibiotics    Review of Systems   Review of Systems  Constitutional:  Negative for appetite change and fatigue.  HENT:  Negative for congestion, ear discharge and sinus pressure.   Eyes:  Negative for discharge.  Respiratory:  Negative for cough.   Cardiovascular:  Negative for chest pain.  Gastrointestinal:  Negative for abdominal pain and diarrhea.   Genitourinary:  Negative for frequency and hematuria.       Penile pain  Musculoskeletal:  Negative for back pain.  Skin:  Negative for rash.  Neurological:  Negative for seizures and headaches.  Psychiatric/Behavioral:  Negative for hallucinations.     Physical Exam Updated Vital Signs BP 111/77   Pulse 60   Temp 98 F (36.7 C) (Oral)   Resp 18   SpO2 99%  Physical Exam Vitals and nursing note reviewed.  Constitutional:      Appearance: He is well-developed.  HENT:     Head: Normocephalic.     Nose: Nose normal.  Eyes:     General: No scleral icterus.    Conjunctiva/sclera: Conjunctivae normal.  Neck:     Thyroid: No thyromegaly.  Cardiovascular:     Rate and Rhythm: Normal rate and regular rhythm.     Heart sounds: No murmur heard.    No friction rub. No gallop.  Pulmonary:     Breath sounds: No stridor. No wheezing or rales.  Chest:     Chest wall: No tenderness.  Abdominal:     General: There is no distension.     Tenderness: There is no abdominal tenderness. There is no rebound.  Genitourinary:    Comments: Patient with priapism Musculoskeletal:        General: Normal range of motion.     Cervical back: Neck supple.  Lymphadenopathy:     Cervical: No cervical adenopathy.  Skin:  Findings: No erythema or rash.  Neurological:     Mental Status: He is alert and oriented to person, place, and time.     Motor: No abnormal muscle tone.     Coordination: Coordination normal.  Psychiatric:        Behavior: Behavior normal.     ED Results / Procedures / Treatments   Labs (all labs ordered are listed, but only abnormal results are displayed) Labs Reviewed - No data to display  EKG None  Radiology No results found.  Procedures Irrigate corpus cavern, priapism  Date/Time: 02/06/2023 8:06 PM  Performed by: Bethann Berkshire, MD Authorized by: Bethann Berkshire, MD  Comments: Patient with priapism.  His penile shaft was injected with phenylephrine.   Patient was anesthetized with 1% lidocaine without epi at the base of his penis at 10:00 and 2 PM.  Patient then had 100 mcg of phenylephrine injected into the base of the penis at the same spots 10 AM and 2 PM.  Patient tolerated the procedure well       Medications Ordered in ED Medications  sodium chloride 0.9 % bolus 1,000 mL (0 mLs Intravenous Stopped 02/06/23 0900)  HYDROmorphone (DILAUDID) injection 1 mg (1 mg Intravenous Given 02/06/23 0747)  LORazepam (ATIVAN) injection 0.5 mg (0.5 mg Intravenous Given 02/06/23 0751)  phenylephrine 200 mcg / ml CONC. DILUTION INJ (ED / Urology USE ONLY) (200 mcg Intracavernosal Given by Other 02/06/23 0815)  lidocaine (PF) (XYLOCAINE) 1 % injection 30 mL (30 mLs Infiltration Given by Other 02/06/23 0815)  HYDROmorphone (DILAUDID) injection 0.5 mg (0.5 mg Intravenous Given 02/06/23 1016)  ondansetron (ZOFRAN) injection 4 mg (4 mg Intravenous Given 02/06/23 1016)    ED Course/ Medical Decision Making/ A&P                             Medical Decision Making Risk Prescription drug management.   Patient with priapism that has been relieved with phenylephrine injections.  He will follow-up with urology        Final Clinical Impression(s) / ED Diagnoses Final diagnoses:  None    Rx / DC Orders ED Discharge Orders     None         Bethann Berkshire, MD 02/06/23 2007

## 2023-02-24 ENCOUNTER — Ambulatory Visit (INDEPENDENT_AMBULATORY_CARE_PROVIDER_SITE_OTHER): Payer: No Typology Code available for payment source | Admitting: Family Medicine

## 2023-02-24 ENCOUNTER — Encounter: Payer: Self-pay | Admitting: Family Medicine

## 2023-02-24 VITALS — BP 112/64 | HR 68 | Temp 98.6°F | Ht 66.0 in | Wt 170.0 lb

## 2023-02-24 DIAGNOSIS — N4830 Priapism, unspecified: Secondary | ICD-10-CM

## 2023-02-24 DIAGNOSIS — Z1159 Encounter for screening for other viral diseases: Secondary | ICD-10-CM

## 2023-02-24 DIAGNOSIS — K1379 Other lesions of oral mucosa: Secondary | ICD-10-CM

## 2023-02-24 DIAGNOSIS — D573 Sickle-cell trait: Secondary | ICD-10-CM

## 2023-02-24 DIAGNOSIS — Z13220 Encounter for screening for lipoid disorders: Secondary | ICD-10-CM | POA: Diagnosis not present

## 2023-02-24 LAB — LIPID PANEL
Cholesterol: 164 mg/dL (ref 0–200)
HDL: 42.6 mg/dL (ref >39.00)
LDL Cholesterol: 95 mg/dL (ref 0–99)
NonHDL: 120.98
Total CHOL/HDL Ratio: 4
Triglycerides: 132 mg/dL (ref 0.0–149.0)
VLDL: 26.4 mg/dL (ref 0.0–40.0)

## 2023-02-24 NOTE — Assessment & Plan Note (Signed)
Discussed risks for passing genetically to a child and the impact depending on the genotype of both parents. Should maintain good hydration and avoid extremes of altitude or non-pressurized flights.

## 2023-02-24 NOTE — Progress Notes (Signed)
[facility] [address] [place] [address] Dept: [phone] Dept Fax: [phone]  New Patient Office Visit  Subjective:    Patient ID: Gabriel Thomas, male    DOB: Jul 08, 1986, 37 y.o..   MRN: [id number]  Chief Complaint  Patient presents with   Establish Care    NP- establish care.  C/o having knot on inside jaw (RT).     History of Present Illness:  Patient is in today to establish care. Gabriel Thomas was born in [place], [place]. He grew up in [place]. He attended [employer] receiving an associates degree and certification in Recruitment consultant. 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 He is single and has no children. He admits to smoking Black & Milds on the weekend. He only occasionally drinks alcohol. He denies drug use.  Gabriel Thomas was discovered to have sickle cell trait during his time int he [employer] in [place].  He is unsure of which parent he may have inherited this from. He is aware of implications for any future children. 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  Gabriel Thomas has had 9 ED visit in the past 2 months related to priapism. He typically undergoes cavernosa drainage, injection of phenylephrine, and is give opioids for pain at the time. He did see a urologist in the ED and notes he has been given an appointment for follow-up. He is also being managed on finasteride 5 mg daily and tadalafil 5 mg daily. He notes he has not been taking the tadalafil, concerned that this may precipitate rather than help the priapism. He has not been treated with hydroxyurea.  Gabriel Thomas also notes he has a lump on his right buccal mucosa. He has repeatedly bitten this while chewing food.  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 30 tablet 1   tadalafil (CIALIS) 5 MG tablet Take 1 tablet (5 mg total) by mouth daily. For prevention priapism 30 tablet 1   oxyCODONE-acetaminophen (PERCOCET) 5-325 MG tablet Take 1 tablet by mouth every 4 (four) hours as needed. (Patient not taking: Reported on 01/17/2023) 20 tablet 0   No facility-administered medications prior to visit.   Allergies  Allergen Reactions   Aspirin Other (See Comments)   Primaquine Other (See Comments)   Sulfa Antibiotics Other (See Comments)   Objective:   Today's Vitals   02/24/23 1036  BP: 112/64  Pulse: 68  Temp: 98.6 F (37 C)  TempSrc: Temporal  SpO2: 97%  Weight: 170 lb (77.1 kg)  Height: 5\' 6"  (1.676 m)   Body mass index is 27.44 kg/m.   General: Well developed, well nourished. No acute distress. Mouth: There is a 1 cm raised nodule of the buccal mucosa. This is mildly firm to palpation. Psych: Alert and oriented. Normal mood  and affect.  Health Maintenance Due  Topic Date Due   Hepatitis C Screening  Never done   Lab Results:    Latest Ref Rng & Units 01/05/2023   10:49 AM 10/17/2022   12:15 AM 02/23/2012    4:06 PM  CBC  WBC 4.0 - 10.5 K/uL 5.1  4.5  5.5   Hemoglobin 13.0 - 17.0 g/dL 09.8  11.9  14.7   Hematocrit 39.0 - 52.0 % 41.6  42.9  42.8   Platelets 150 - 400 K/uL 274  274  221.0       Latest Ref Rng & Units 01/05/2023   10:49 AM 10/17/2022   12:15 AM 02/23/2012    4:06 PM  CMP  Glucose 70 - 99 mg/dL 94  829  87   BUN 6 - 20 mg/dL 13  10  11    Creatinine 0.61 - 1.24 mg/dL 5.62  1.30  1.1    Sodium 135 - 145 mmol/L 139  139  142   Potassium 3.5 - 5.1 mmol/L 3.9  3.8  4.3   Chloride 98 - 111 mmol/L 104  103  105   CO2 22 - 32 mmol/L 26  27  28    Calcium 8.9 - 10.3 mg/dL 9.4  9.2  8.6   Total Protein 6.0 - 8.3 g/dL   7.6   Total Bilirubin 0.3 - 1.2 mg/dL   1.2   Alkaline Phos 39 - 117 U/L   87   AST 0 - 37 U/L   26   ALT 0 - 53 U/L   27       Assessment & Plan:   Problem List Items Addressed This Visit       Genitourinary   Priapism - Primary    I recommend he try avoiding use of nicotine and alcohol to see if this will reducing recurrent episodes of priapism. He should continue the use of finasteride 5 mg daily and start taking the tadalafil 5 mg daily. We did discuss the hydroxyurea is recommended for prevention of recurrent priapism. We would consider starting this if he continues to have recurrent bouts (currently none in 2 weeks).        Other   Sickle cell trait (HCC)    Discussed risks for passing genetically to a child and the impact depending on the genotype of both parents. Should maintain good hydration and avoid extremes of altitude or non-pressurized flights.      Mass of buccal mucosa    Likely a reactive nodule form repeated traumas. I recommend he see ENT to consider having this excised.      Relevant Orders   Ambulatory referral to ENT   Other Visit Diagnoses     Screening for lipid disorders       Relevant Orders   Lipid panel   Encounter for hepatitis C screening test for low risk patient       Relevant Orders   HCV Ab w Reflex to Quant PCR       Return in about 6 months (around 08/26/2023) for Reassessment.   Gabriel Mast, MD

## 2023-02-24 NOTE — Assessment & Plan Note (Signed)
I recommend he try avoiding use of nicotine and alcohol to see if this will reducing recurrent episodes of priapism. He should continue the use of finasteride 5 mg daily and start taking the tadalafil 5 mg daily. We did discuss the hydroxyurea is recommended for prevention of recurrent priapism. We would consider starting this if he continues to have recurrent bouts (currently none in 2 weeks).

## 2023-02-24 NOTE — Assessment & Plan Note (Signed)
Likely a reactive nodule form repeated traumas. I recommend he see ENT to consider having this excised.

## 2023-02-25 LAB — HCV INTERPRETATION

## 2023-02-25 LAB — HCV AB W REFLEX TO QUANT PCR: HCV Ab: NONREACTIVE

## 2023-07-05 ENCOUNTER — Other Ambulatory Visit: Payer: Self-pay

## 2023-07-05 ENCOUNTER — Emergency Department (HOSPITAL_COMMUNITY)
Admission: EM | Admit: 2023-07-05 | Discharge: 2023-07-05 | Disposition: A | Discharge status: Home / Self Care | Payer: No Typology Code available for payment source | Attending: Emergency Medicine | Admitting: Emergency Medicine

## 2023-07-05 ENCOUNTER — Encounter (HOSPITAL_COMMUNITY): Payer: Self-pay | Admitting: *Deleted

## 2023-07-05 DIAGNOSIS — N4830 Priapism, unspecified: Secondary | ICD-10-CM | POA: Insufficient documentation

## 2023-07-05 LAB — BASIC METABOLIC PANEL
Anion gap: 9 (ref 5–15)
BUN: 18 mg/dL (ref 6–20)
CO2: 25 mmol/L (ref 22–32)
Calcium: 9.1 mg/dL (ref 8.9–10.3)
Chloride: 104 mmol/L (ref 98–111)
Creatinine, Ser: 1.05 mg/dL (ref 0.61–1.24)
GFR, Estimated: >60 mL/min (ref >60)
Glucose, Bld: 88 mg/dL (ref 70–99)
Potassium: 4.1 mmol/L (ref 3.5–5.1)
Sodium: 138 mmol/L (ref 135–145)

## 2023-07-05 LAB — CBC WITH DIFFERENTIAL/PLATELET
Abs Immature Granulocytes: 0.02 10*3/uL (ref 0.00–0.07)
Basophils Absolute: 0 10*3/uL (ref 0.0–0.1)
Basophils Relative: 1 %
Eosinophils Absolute: 0.1 10*3/uL (ref 0.0–0.5)
Eosinophils Relative: 1 %
HCT: 41.3 % (ref 39.0–52.0)
Hemoglobin: 13.6 g/dL (ref 13.0–17.0)
Immature Granulocytes: 0 %
Lymphocytes Relative: 34 %
Lymphs Abs: 1.8 10*3/uL (ref 0.7–4.0)
MCH: 30.2 pg (ref 26.0–34.0)
MCHC: 32.9 g/dL (ref 30.0–36.0)
MCV: 91.6 fL (ref 80.0–100.0)
Monocytes Absolute: 0.5 10*3/uL (ref 0.1–1.0)
Monocytes Relative: 9 %
Neutro Abs: 2.9 10*3/uL (ref 1.7–7.7)
Neutrophils Relative %: 55 %
Platelets: 260 10*3/uL (ref 150–400)
RBC: 4.51 MIL/uL (ref 4.22–5.81)
RDW: 12.5 % (ref 11.5–15.5)
WBC: 5.3 10*3/uL (ref 4.0–10.5)
nRBC: 0 % (ref 0.0–0.2)

## 2023-07-05 MED ORDER — HYDROMORPHONE HCL 1 MG/ML IJ SOLN
1.0000 mg | Freq: Once | INTRAMUSCULAR | Status: AC
  Administered 2023-07-05: 1 mg via INTRAVENOUS
  Filled 2023-07-05: qty 1

## 2023-07-05 MED ORDER — ONDANSETRON HCL 4 MG/2ML IJ SOLN
4.0000 mg | Freq: Once | INTRAMUSCULAR | Status: AC
  Administered 2023-07-05: 4 mg via INTRAVENOUS
  Filled 2023-07-05: qty 2

## 2023-07-05 MED ORDER — PHENYLEPHRINE 200 MCG/ML FOR PRIAPISM / HYPOTENSION
200.0000 ug | INTRAMUSCULAR | Status: DC | PRN
  Filled 2023-07-05: qty 50

## 2023-07-05 MED ORDER — FENTANYL CITRATE PF 50 MCG/ML IJ SOSY
50.0000 ug | PREFILLED_SYRINGE | INTRAMUSCULAR | Status: DC | PRN
  Administered 2023-07-05: 50 ug via INTRAVENOUS
  Filled 2023-07-05: qty 1

## 2023-07-05 MED ORDER — LIDOCAINE HCL (PF) 1 % IJ SOLN
30.0000 mL | Freq: Once | INTRAMUSCULAR | Status: AC
  Administered 2023-07-05: 30 mL
  Filled 2023-07-05: qty 30

## 2023-07-05 NOTE — ED Triage Notes (Signed)
History of priapism, last 3 hours medications have not worked.

## 2023-07-05 NOTE — ED Provider Notes (Signed)
EMERGENCY DEPARTMENT AT [facility] Provider Note   CSN: [id number] Arrival date & time: 07/05/23  1311     History  Chief Complaint  Patient presents with   priapism    Gabriel Thomas is a 37 y.o. male.  He has a history of recurrent priapism.  He is using finasteride and Cialis which has lessened his exacerbations.  He is coming in with priapism that started about 4 hours ago.  He said they usually have to do an injection after some pain medication.  He denies any other illness.  No concern for STD.  He denies that he is on any other medications or supplements.  He does have a history of sickle cell trait.  Has seen urology in the past but it has been a while.  The history is provided by the patient.  Male GU Problem Presenting symptoms: penile pain   Context: spontaneously   Relieved by:  Nothing Worsened by:  Nothing Ineffective treatments:  Prescription drugs Associated symptoms: priapism   Associated symptoms: no abdominal pain, no fever, no nausea, no scrotal swelling and no vomiting   Risk factors: no recent infection        Home Medications Prior to Admission medications   Medication Sig Start Date End Date Taking? Authorizing Provider  finasteride (PROSCAR) 5 MG tablet Take 1 tablet (5 mg total) by mouth daily. 02/06/23   Stoneking, Danford Bad., MD  tadalafil (CIALIS) 5 MG tablet Take 1 tablet (5 mg total) by mouth daily. For prevention priapism 02/06/23   Milderd Meager., MD  azithromycin (ZITHROMAX Z-PAK) 250 MG tablet As dirrected 02/23/12 09/05/19  Plotnikov, Georgina Quint, MD      Allergies    Aspirin, Primaquine, and Sulfa antibiotics    Review of Systems   Review of Systems  Constitutional:  Negative for fever.  Gastrointestinal:  Negative for abdominal pain, nausea and vomiting.  Genitourinary:  Positive for penile pain. Negative for scrotal swelling.    Physical Exam Updated Vital Signs BP (!) 118/100 (BP Location: Left Arm)    Pulse 71   Temp 98.2 F (36.8 C) (Oral)   Resp 18   Ht 5\' 7"  (1.702 m)   Wt 79.4 kg   SpO2 99%   BMI 27.41 kg/m  Physical Exam Vitals and nursing note reviewed.  Constitutional:      General: He is not in acute distress.    Appearance: Normal appearance. He is well-developed.  HENT:     Head: Normocephalic and atraumatic.  Eyes:     Conjunctiva/sclera: Conjunctivae normal.  Cardiovascular:     Rate and Rhythm: Normal rate and regular rhythm.     Heart sounds: No murmur heard. Pulmonary:     Effort: Pulmonary effort is normal. No respiratory distress.     Breath sounds: Normal breath sounds.  Abdominal:     Palpations: Abdomen is soft.     Tenderness: There is no abdominal tenderness. There is no guarding or rebound.  Musculoskeletal:        General: No swelling.     Cervical back: Neck supple.  Skin:    General: Skin is warm and dry.     Capillary Refill: Capillary refill takes less than 2 seconds.  Neurological:     General: No focal deficit present.     Mental Status: He is alert.     ED Results / Procedures / Treatments   Labs (all labs ordered are listed, but only abnormal results  are displayed) Labs Reviewed  BASIC METABOLIC PANEL  CBC WITH DIFFERENTIAL/PLATELET  URINALYSIS, ROUTINE W REFLEX MICROSCOPIC    EKG None  Radiology No results found.  Procedures Irrigate corpus cavern, priapism  Date/Time: 07/05/2023 5:56 PM  Performed by: Terrilee Files, MD Authorized by: Terrilee Files, MD  Consent: Verbal consent obtained. Consent given by: patient Patient understanding: patient states understanding of the procedure being performed Patient identity confirmed: verbally with patient Preparation: Patient was prepped and draped in the usual sterile fashion. Local anesthesia used: yes  Anesthesia: Local anesthesia used: yes Local Anesthetic: lidocaine 1% without epinephrine  Sedation: Patient sedated: no  Patient tolerance: patient tolerated  the procedure well with no immediate complications Comments: Injection patient had 10 and 2 with 100 mcg of phenylephrine.  Repeated x 1.  Improvement in symptoms.       Medications Ordered in ED Medications  fentaNYL (SUBLIMAZE) injection 50 mcg (50 mcg Intravenous Given 07/05/23 1351)  phenylephrine 200 mcg / ml CONC. DILUTION INJ (ED / Urology USE ONLY) (has no administration in time range)  ondansetron (ZOFRAN) injection 4 mg (4 mg Intravenous Given 07/05/23 1347)  HYDROmorphone (DILAUDID) injection 1 mg (1 mg Intravenous Given 07/05/23 1411)  lidocaine (PF) (XYLOCAINE) 1 % injection 30 mL (30 mLs Infiltration Given 07/05/23 1421)  HYDROmorphone (DILAUDID) injection 1 mg (1 mg Intravenous Given 07/05/23 1516)  HYDROmorphone (DILAUDID) injection 1 mg (1 mg Intravenous Given 07/05/23 1610)    ED Course/ Medical Decision Making/ A&P Clinical Course as of 07/05/23 1755  Mon Jul 05, 2023  1445 Patient states he had a little bit of relief with the first injection.  He is asking to hold off another 10 minutes to see if it will work a little more. [MB]  1559 States he feels improved after second injection. [MB]  1630 Patient states he is feeling much better and is comfortable plan for discharge.  Will give him contact information for urology.  Return instructions discussed [MB]    Clinical Course User Index [MB] Terrilee Files, MD                                 Medical Decision Making Amount and/or Complexity of Data Reviewed Labs: ordered.  Risk Prescription drug management.   This patient complains of priapism; this involves an extensive number of treatment Options and is a complaint that carries with it a high risk of complications and morbidity. The differential includes priapism, STD, infarct  I ordered, reviewed and interpreted labs, which included CBC normal chemistries normal urinalysis ordered not obtained IV pain medication Previous records obtained and reviewed  in epic including multiple ED visits for similar presentation Social determinants considered, tobacco use Critical Interventions: None  After the interventions stated above, I reevaluated the patient and found patient's symptoms to be improving Admission and further testing considered, he feels he can manage his symptoms for now at home and is asking to be discharged.  He understands to follow-up with urology and return instructions discussed.         Final Clinical Impression(s) / ED Diagnoses Final diagnoses:  Priapism    Rx / DC Orders ED Discharge Orders     None         Terrilee Files, MD 07/05/23 1758

## 2023-10-20 ENCOUNTER — Other Ambulatory Visit: Payer: Self-pay

## 2023-10-20 ENCOUNTER — Emergency Department (HOSPITAL_COMMUNITY)
Admission: EM | Admit: 2023-10-20 | Discharge: 2023-10-20 | Disposition: A | Discharge status: Home / Self Care | Payer: Self-pay | Attending: Emergency Medicine | Admitting: Emergency Medicine

## 2023-10-20 DIAGNOSIS — N4830 Priapism, unspecified: Secondary | ICD-10-CM | POA: Insufficient documentation

## 2023-10-20 DIAGNOSIS — Z5321 Procedure and treatment not carried out due to patient leaving prior to being seen by health care provider: Secondary | ICD-10-CM | POA: Insufficient documentation

## 2023-10-20 LAB — CBC WITH DIFFERENTIAL/PLATELET
Abs Immature Granulocytes: 0.03 10*3/uL (ref 0.00–0.07)
Basophils Absolute: 0 10*3/uL (ref 0.0–0.1)
Basophils Relative: 1 %
Eosinophils Absolute: 0.1 10*3/uL (ref 0.0–0.5)
Eosinophils Relative: 2 %
HCT: 43.7 % (ref 39.0–52.0)
Hemoglobin: 14.6 g/dL (ref 13.0–17.0)
Immature Granulocytes: 1 %
Lymphocytes Relative: 43 %
Lymphs Abs: 2.9 10*3/uL (ref 0.7–4.0)
MCH: 30.7 pg (ref 26.0–34.0)
MCHC: 33.4 g/dL (ref 30.0–36.0)
MCV: 91.8 fL (ref 80.0–100.0)
Monocytes Absolute: 0.5 10*3/uL (ref 0.1–1.0)
Monocytes Relative: 9 %
Neutro Abs: 2.8 10*3/uL (ref 1.7–7.7)
Neutrophils Relative %: 44 %
Platelets: 264 10*3/uL (ref 150–400)
RBC: 4.76 MIL/uL (ref 4.22–5.81)
RDW: 12.3 % (ref 11.5–15.5)
WBC: 6.4 10*3/uL (ref 4.0–10.5)
nRBC: 0 % (ref 0.0–0.2)

## 2023-10-20 LAB — COMPREHENSIVE METABOLIC PANEL
ALT: 21 U/L (ref 0–44)
AST: 16 U/L (ref 15–41)
Albumin: 4.1 g/dL (ref 3.5–5.0)
Alkaline Phosphatase: 79 U/L (ref 38–126)
Anion gap: 10 (ref 5–15)
BUN: 18 mg/dL (ref 6–20)
CO2: 23 mmol/L (ref 22–32)
Calcium: 9.3 mg/dL (ref 8.9–10.3)
Chloride: 106 mmol/L (ref 98–111)
Creatinine, Ser: 1.03 mg/dL (ref 0.61–1.24)
GFR, Estimated: >60 mL/min (ref >60)
Glucose, Bld: 97 mg/dL (ref 70–99)
Potassium: 4.1 mmol/L (ref 3.5–5.1)
Sodium: 139 mmol/L (ref 135–145)
Total Bilirubin: 1.2 mg/dL (ref 0.0–1.2)
Total Protein: 7.1 g/dL (ref 6.5–8.1)

## 2023-10-20 MED ORDER — TERBUTALINE SULFATE 2.5 MG PO TABS
5.0000 mg | ORAL_TABLET | Freq: Once | ORAL | Status: DC
  Filled 2023-10-20: qty 2

## 2023-10-20 NOTE — ED Provider Triage Note (Signed)
Emergency Medicine Provider Triage Evaluation Note  Gabriel Thomas , a 38 y.o. male  was evaluated in triage.  Pt complains of priapism.  Reports erection since 8:30 AM this morning.  Has history of priapism in the past.  Review of Systems  Positive: Priapism  Negative: CP, SOB  Physical Exam  BP (!) 143/90 (BP Location: Left Arm)   Pulse 71   Temp 98.1 F (36.7 C) (Oral)   Ht 5\' 7"  (1.702 m)   Wt 79.4 kg   SpO2 98%   BMI 27.41 kg/m  Gen:   Awake, no distress   Resp:  Normal effort  MSK:   Moves extremities without difficulty  Other:   Medical Decision Making  Medically screening exam initiated at 1:21 PM.  Appropriate orders placed.  Gabriel Thomas was informed that the remainder of the evaluation will be completed by another provider, this initial triage assessment does not replace that evaluation, and the importance of remaining in the ED until their evaluation is complete.    Maxwell Marion, PA-C 10/20/23 1323

## 2023-10-20 NOTE — ED Triage Notes (Signed)
Patient to ED by POV with c/o Sickle Cell Pain

## 2023-10-21 ENCOUNTER — Telehealth: Payer: Self-pay

## 2023-10-21 NOTE — Transitions of Care (Post Inpatient/ED Visit) (Signed)
   10/21/2023  Name: Gabriel Thomas MRN: [id number] DOB: 02-26-1986  Today's TOC FU Call Status: Today's TOC FU Call Status:: Successful TOC FU Call Completed TOC FU Call Complete Date: 10/21/23 Patient's Name and Date of Birth confirmed.  Transition Care Management Follow-up Telephone Call Date of Discharge: 10/21/23 Discharge Facility: [facility]) Type of Discharge: Emergency Department How have you been since you were released from the hospital?: Better Any questions or concerns?: No  Items Reviewed: Did you receive and understand the discharge instructions provided?: Yes Medications obtained,verified, and reconciled?: No Any new allergies since your discharge?: No Dietary orders reviewed?: NA Do you have support at home?: Yes  Medications Reviewed Today: Medications Reviewed Today   Medications were not reviewed in this encounter     Home Care and Equipment/Supplies: Any new equipment or medical supplies ordered?: NA  Functional Questionnaire: Do you need assistance with bathing/showering or dressing?: No Do you need assistance with meal preparation?: No Do you need assistance with eating?: No Do you have difficulty maintaining continence: No Do you need assistance with getting out of bed/getting out of a chair/moving?: No Do you have difficulty managing or taking your medications?: No  Follow up appointments reviewed: PCP Follow-up appointment confirmed?: No Specialist Hospital Follow-up appointment confirmed?: No Do you need transportation to your follow-up appointment?: No Do you understand care options if your condition(s) worsen?: Yes-patient verbalized understanding    SIGNATURE Arvil Persons, BSN, RN

## 2023-10-22 ENCOUNTER — Emergency Department (HOSPITAL_COMMUNITY)
Admission: EM | Admit: 2023-10-22 | Discharge: 2023-10-22 | Disposition: A | Discharge status: Home / Self Care | Payer: Self-pay | Attending: Emergency Medicine | Admitting: Emergency Medicine

## 2023-10-22 ENCOUNTER — Encounter (HOSPITAL_COMMUNITY): Payer: Self-pay

## 2023-10-22 ENCOUNTER — Other Ambulatory Visit: Payer: Self-pay

## 2023-10-22 DIAGNOSIS — N4830 Priapism, unspecified: Secondary | ICD-10-CM | POA: Insufficient documentation

## 2023-10-22 MED ORDER — PHENYLEPHRINE 200 MCG/ML FOR PRIAPISM / HYPOTENSION
100.0000 ug | Freq: Once | INTRAMUSCULAR | Status: AC
  Administered 2023-10-22: 100 ug via INTRACAVERNOUS
  Filled 2023-10-22: qty 50

## 2023-10-22 MED ORDER — HYDROMORPHONE HCL 1 MG/ML IJ SOLN
1.0000 mg | Freq: Once | INTRAMUSCULAR | Status: AC
  Administered 2023-10-22: 1 mg via INTRAMUSCULAR
  Filled 2023-10-22: qty 1

## 2023-10-22 MED ORDER — LIDOCAINE HCL (PF) 1 % IJ SOLN
5.0000 mL | Freq: Once | INTRAMUSCULAR | Status: AC
  Administered 2023-10-22: 5 mL
  Filled 2023-10-22: qty 30

## 2023-10-22 NOTE — ED Triage Notes (Signed)
Patient reports priapism x 4 hours. Hx of same.

## 2023-10-22 NOTE — Discharge Instructions (Addendum)
Follow-up with the urologist to discuss further treatment of your recurrent episodes.

## 2023-10-22 NOTE — ED Provider Notes (Signed)
[place] EMERGENCY DEPARTMENT AT [facility] Provider Note   CSN: [id number] Arrival date & time: 10/22/23  6295     History  Chief Complaint  Patient presents with   Priaprism    Gabriel Thomas is a 38 y.o. male.  HPI   Patient presents to the ED with complaints of penile pain related to priapism ongoing for several hours now.  Patient states he has had this several times in the past.  He has had numerous visits to the ED for this condition.  Patient checked into the emergency room to be seen on January 29.  He was screened at triage and ended up leaving before evaluation due to the long wait.  His priapism resolved on its own spontaneously.  Home Medications Prior to Admission medications   Medication Sig Start Date End Date Taking? Authorizing Provider  finasteride (PROSCAR) 5 MG tablet Take 1 tablet (5 mg total) by mouth daily. 02/06/23   Stoneking, Danford Bad., MD  tadalafil (CIALIS) 5 MG tablet Take 1 tablet (5 mg total) by mouth daily. For prevention priapism 02/06/23   Milderd Meager., MD  azithromycin (ZITHROMAX Z-PAK) 250 MG tablet As dirrected 02/23/12 09/05/19  Plotnikov, Georgina Quint, MD      Allergies    Aspirin, Primaquine, and Sulfa antibiotics    Review of Systems   Review of Systems  Physical Exam Updated Vital Signs BP (!) 132/103 (BP Location: Right Arm)   Pulse 60   Temp 98.1 F (36.7 C) (Oral)   Resp 18   Ht 1.702 m (5\' 7" )   Wt 79.4 kg   SpO2 100%   BMI 27.41 kg/m  Physical Exam Vitals and nursing note reviewed.  Constitutional:      Appearance: He is well-developed.     Comments: Appears to be in pain  HENT:     Head: Normocephalic and atraumatic.     Right Ear: External ear normal.     Left Ear: External ear normal.  Eyes:     General: No scleral icterus.       Right eye: No discharge.        Left eye: No discharge.     Conjunctiva/sclera: Conjunctivae normal.  Neck:     Trachea: No tracheal deviation.  Cardiovascular:      Rate and Rhythm: Normal rate and regular rhythm.  Pulmonary:     Effort: Pulmonary effort is normal. No respiratory distress.     Breath sounds: Normal breath sounds. No stridor.  Abdominal:     General: There is no distension.  Musculoskeletal:        General: No swelling or deformity.     Cervical back: Neck supple.  Skin:    General: Skin is warm and dry.     Findings: No rash.  Neurological:     Mental Status: He is alert. Mental status is at baseline.     Cranial Nerves: No dysarthria or facial asymmetry.     Motor: No seizure activity.     ED Results / Procedures / Treatments   Labs (all labs ordered are listed, but only abnormal results are displayed) Labs Reviewed - No data to display  EKG None  Radiology No results found.  Procedures Irrigate corpus cavern, priapism  Date/Time: 10/22/2023 9:03 AM  Performed by: Linwood Dibbles, MD Authorized by: Linwood Dibbles, MD  Consent: Verbal consent obtained. Risks and benefits: risks, benefits and alternatives were discussed Consent given by: patient Patient identity confirmed: verbally  with patient Time out: Immediately prior to procedure a "time out" was called to verify the correct patient, procedure, equipment, support staff and site/side marked as required. Preparation: Patient was prepped and draped in the usual sterile fashion. Local anesthesia used: yes Anesthesia: local infiltration  Anesthesia: Local anesthesia used: yes Local Anesthetic: lidocaine 1% without epinephrine Anesthetic total: 1 mL  Sedation: Patient sedated: no  Patient tolerance: patient tolerated the procedure well with no immediate complications Comments: 1 ml, 200 mcg of phenylephrine injected into right corporus cavernosum after 3 cc dark blood aspirated       Medications Ordered in ED Medications  phenylephrine 200 mcg / ml CONC. DILUTION INJ (ED / Urology USE ONLY) (100 mcg Intracavernosal Given by Other 10/22/23 0843)   HYDROmorphone (DILAUDID) injection 1 mg (1 mg Intramuscular Given 10/22/23 0821)  lidocaine (PF) (XYLOCAINE) 1 % injection 5 mL (5 mLs Infiltration Given by Other 10/22/23 5284)    ED Course/ Medical Decision Making/ A&P Clinical Course as of 10/22/23 1019  Fri Oct 22, 2023  1005 Pt feels like he is starting to improve but not resolved at this point  [JK]    Clinical Course User Index [JK] Linwood Dibbles, MD                                 Medical Decision Making Problems Addressed: Priapism: acute illness or injury that poses a threat to life or bodily functions  Risk Prescription drug management. Drug therapy requiring intensive monitoring for toxicity.   Patient presented to the ED with complaints of priapism.  Patient has had similar episodes in the past.  He was in the ED just a few days ago where it ended up resolving spontaneously after having to wait for prolonged period of time to be evaluated.  Patient has had these issues in the past.  He responded to treatment in the ED with phenylephrine injection intracorporeally.  Will have him follow-up with neurology as an outpatient        Final Clinical Impression(s) / ED Diagnoses Final diagnoses:  Priapism    Rx / DC Orders ED Discharge Orders     None         Linwood Dibbles, MD 10/22/23 1020

## 2023-10-22 NOTE — ED Notes (Signed)
Pt stated he feels a lot better and is requesting to see the provider. Pt rated pain 7/10 and stated it is significantly better than previously.

## 2023-10-25 ENCOUNTER — Telehealth: Payer: Self-pay

## 2023-10-25 NOTE — Telephone Encounter (Signed)
Called patient to schedule ED follow up appointment; patient did't answer and was unable to leave VM due to mailbox not being set up

## 2023-10-26 NOTE — Transitions of Care (Post Inpatient/ED Visit) (Signed)
   10/26/2023  Name: Gabriel Thomas MRN: [id number] DOB: [birth year]  Today's TOC FU Call Status: Today's TOC FU Call Status:: Successful TOC FU Call Completed Unsuccessful Call (1st Attempt) Date: 10/25/23 [facility] FU Call Complete Date: 10/26/23 Patient's Name and Date of Birth confirmed.  Transition Care Management Follow-up Telephone Call Date of Discharge: 10/22/23 Discharge Facility: [facility]) Type of Discharge: Emergency Department Reason for ED Visit: Other: (priapism) How have you been since you were released from the hospital?: Better Any questions or concerns?: No  Items Reviewed: Did you receive and understand the discharge instructions provided?: No Medications obtained,verified, and reconciled?: Yes (Medications Reviewed) Any new allergies since your discharge?: No Dietary orders reviewed?: No Do you have support at home?: No  Medications Reviewed Today: Medications Reviewed Today     Reviewed by Eugenie Ulanda CROME, CMA (Certified Medical Assistant) on 10/26/23 at 1516  Med List Status: <None>   Medication Order Taking? Sig Documenting Provider Last Dose Status Informant    Discontinued 09/05/19 0030   finasteride  (PROSCAR ) 5 MG tablet [id number] Yes Take 1 tablet (5 mg total) by mouth daily. Stoneking, Adine PARAS., MD Taking Active   tadalafil  (CIALIS ) 5 MG tablet [id number] Yes Take 1 tablet (5 mg total) by mouth daily. For prevention priapism Stoneking, Adine PARAS., MD Taking Active             Home Care and Equipment/Supplies: Were Home Health Services Ordered?: No Any new equipment or medical supplies ordered?: No  Functional Questionnaire: Do you need assistance with bathing/showering or dressing?: No Do you need assistance with meal preparation?: No Do you need assistance with eating?: No Do you have difficulty maintaining continence: No Do you need assistance with getting out of bed/getting out of a chair/moving?: No Do you have difficulty managing or  taking your medications?: No  Follow up appointments reviewed: PCP Follow-up appointment confirmed?: NA (working on an open appointment schedules are booked) Specialist Hospital Follow-up appointment confirmed?: No Reason Specialist Follow-Up Not Confirmed: Appointment Sceduled by [facility] Calling Clinician Do you need transportation to your follow-up appointment?: No Do you understand care options if your condition(s) worsen?: Yes-patient verbalized understanding    SIGNATURE [place] Eugenie, RMA

## 2023-10-26 NOTE — Transitions of Care (Post Inpatient/ED Visit) (Signed)
   10/26/2023  Name: Gabriel Thomas MRN: [id number] DOB: September 30, 1985  Today's TOC FU Call Status: Today's TOC FU Call Status:: Unsuccessful Call (1st Attempt) Unsuccessful Call (1st Attempt) Date: 10/25/23  Attempted to reach the patient regarding the most recent Inpatient/ED visit.  Follow Up Plan: No further outreach attempts will be made at this time. We have been unable to contact the patient.  Signature Ulanda Eugenie KRAFT

## 2023-10-29 ENCOUNTER — Inpatient Hospital Stay: Payer: Self-pay | Admitting: Family Medicine

## 2023-11-02 ENCOUNTER — Telehealth: Payer: Self-pay | Admitting: Family Medicine

## 2023-11-02 ENCOUNTER — Ambulatory Visit: Payer: Self-pay | Admitting: Family Medicine

## 2023-11-02 NOTE — Telephone Encounter (Signed)
11/02/2023 1st no show, letter sent via [facility]

## 2024-03-04 ENCOUNTER — Emergency Department (HOSPITAL_COMMUNITY)
Admission: EM | Admit: 2024-03-04 | Discharge: 2024-03-04 | Disposition: A | Discharge status: Home / Self Care | Payer: Self-pay | Attending: Emergency Medicine | Admitting: Emergency Medicine

## 2024-03-04 DIAGNOSIS — S0502XA Injury of conjunctiva and corneal abrasion without foreign body, left eye, initial encounter: Secondary | ICD-10-CM | POA: Insufficient documentation

## 2024-03-04 DIAGNOSIS — X58XXXA Exposure to other specified factors, initial encounter: Secondary | ICD-10-CM | POA: Insufficient documentation

## 2024-03-04 MED ORDER — CIPROFLOXACIN HCL 0.3 % OP OINT: TOPICAL_OINTMENT | OPHTHALMIC | 0 refills | Status: DC

## 2024-03-04 MED ORDER — CIPROFLOXACIN HCL 0.3 % OP OINT: TOPICAL_OINTMENT | OPHTHALMIC | 0 refills | Status: AC

## 2024-03-04 MED ORDER — TETRACAINE HCL 0.5 % OP SOLN
2.0000 [drp] | Freq: Once | OPHTHALMIC | Status: AC
  Administered 2024-03-04: 2 [drp] via OPHTHALMIC
  Filled 2024-03-04: qty 4

## 2024-03-04 MED ORDER — FLUORESCEIN SODIUM 1 MG OP STRP
1.0000 | ORAL_STRIP | Freq: Once | OPHTHALMIC | Status: AC
  Administered 2024-03-04: 1 via OPHTHALMIC
  Filled 2024-03-04: qty 1

## 2024-03-04 NOTE — ED Triage Notes (Signed)
 Patient complains of left eye pain and swelling. 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 Hurts to open eye and light sensitive. Swelling to left eyelid. JRPRN

## 2024-03-04 NOTE — Discharge Instructions (Addendum)
 As discussed, no corneal abrasion of the left eye.  Please do not wear your contacts until your symptoms resolve. Use cipro eye drops for the next 5 days - 2 drops in the left eye 4x a day.  I have provided information for ophthalmology to follow-up with.  Please give the office a call on Monday to schedule an appointment for reevaluation.  Get help right away if: You have very bad eye pain that does not get better with medicine. You lose your eyesight.

## 2024-03-04 NOTE — ED Provider Notes (Signed)
 [place] EMERGENCY DEPARTMENT AT [facility] Provider Note   CSN: [id number] Arrival date & time: 03/04/24  0932     Patient presents with: Eye Problem   Gabriel Thomas is a 38 y.o. male medical history of sickle cell trait presents the ED today for eye discomfort.  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  He thought it was something to do with his contact.  States that when he could have his contact last night he was still having discomfort.  States feeling a foreign body sensation in the left eye and has tearing of the eye as well.  Endorses light sensitivity, at the left eye as well.  No pain to the orbit.  No double vision or blurred vision.    Prior to Admission medications   Medication Sig Start Date End Date Taking? Authorizing Provider  ciprofloxacin (CILOXAN) 0.3 % ophthalmic ointment Place 2 drops in left eye 4 times a day for the next 5 days 03/04/24   Sonnie Dusky, PA-C  finasteride  (PROSCAR ) 5 MG tablet Take 1 tablet (5 mg total) by mouth daily. 02/06/23   Stoneking, Ponce Brisker., MD  tadalafil  (CIALIS ) 5 MG tablet Take 1 tablet (5 mg total) by mouth daily. For prevention priapism 02/06/23   Mellie Sprinkle., MD  azithromycin  (ZITHROMAX  Z-PAK) 250 MG tablet As dirrected 02/23/12 09/05/19  Plotnikov, Aleksei V, MD    Allergies: Aspirin, Primaquine, and Sulfa antibiotics    Review of Systems  Eyes:  Positive for pain.  All other systems reviewed and are negative.   Updated Vital Signs BP 120/77 (BP Location: Left Arm)   Pulse 75   Temp 98.7 F (37.1 C) (Oral)   Resp 18   SpO2 98%   Physical Exam Vitals and nursing note reviewed.  Constitutional:      General: He is not in acute distress.    Appearance: Normal appearance.  HENT:     Head: Normocephalic and atraumatic.     Mouth/Throat:     Mouth: Mucous membranes are moist.   Eyes:     General: No  scleral icterus.       Right eye: No discharge.        Left eye: Discharge present.    Extraocular Movements: Extraocular movements intact.     Pupils: Pupils are equal, round, and reactive to light.     Comments: Conjunctival injections of the left eye with tearing. Some swelling to the left lateral eyelid without TTP.  Corneal abrasion present at inferolateral aspect of left eye at the sclera.   Left eye: 20/100 Right eye: 20/20 Bialteral: 20/15      Cardiovascular:     Rate and Rhythm: Normal rate and regular rhythm.     Pulses: Normal pulses.     Heart sounds: Normal heart sounds.  Pulmonary:     Effort: Pulmonary effort is normal.     Breath sounds: Normal breath sounds.  Abdominal:     Palpations: Abdomen is soft.     Tenderness: There is no abdominal tenderness.   Musculoskeletal:        General: Normal range of motion.     Cervical back: Normal range of motion.   Skin:    General: Skin is warm and dry.     Findings: No rash.   Neurological:     General: No focal deficit present.  Mental Status: He is alert.   Psychiatric:        Mood and Affect: Mood normal.        Behavior: Behavior normal.    (all labs ordered are listed, but only abnormal results are displayed) Labs Reviewed - No data to display  EKG: None  Radiology: No results found.   Procedures   Medications Ordered in the ED  tetracaine (PONTOCAINE) 0.5 % ophthalmic solution 2 drop (has no administration in time range)  fluorescein ophthalmic strip 1 strip (has no administration in time range)                                    Medical Decision Making Risk Prescription drug management.   This patient presents to the ED for concern of eye problem, this involves an extensive number of treatment options, and is a complaint that carries with it a high risk of complications and morbidity.   Differential diagnosis includes: Conjunctivitis, corneal abrasion, corneal ulceration, foreign  body, etc.   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  He thought the eye irritation was due to his contact with his symptoms did not improve when he took it out last night. There is conjunctival injections to the left eye and tearing.  He endorses photophobia as well.  Other than that, there is no pain with EOMs.  Denies vision changes.  No pain around the eye. I ordered medications including: Tetracaine and fluorescein for wood's lamp exam  Reevaluation of the patient after these medicines showed that the patient's discomfort improved with tetracaine. Discussed findings with patient.  Instructed not to wear his contact lenses until he completes antibiotic eyedrops.  All questions answered.  Information for ophthalmology provided for patient follow-up with.  Social Determinants of Health  Tobacco use   Test / Admission - Considered  Patient stable and safe discharge home. Return precautions given.    Final diagnoses:  Abrasion of left cornea, initial encounter    ED Discharge Orders          Ordered    ciprofloxacin (CILOXAN) 0.3 % ophthalmic ointment  Status:  Discontinued        03/04/24 1055    ciprofloxacin (CILOXAN) 0.3 % ophthalmic ointment        03/04/24 1118               Sonnie Dusky, PA-C 03/04/24 1131    Merdis Stalling, MD 03/05/24 [phone]

## 2024-03-07 ENCOUNTER — Telehealth: Payer: Self-pay

## 2024-03-07 NOTE — Transitions of Care (Post Inpatient/ED Visit) (Signed)
   03/07/2024  Name: Gabriel Thomas MRN: [id number] DOB: Jan 31, 1986  Today's TOC FU Call Status: Today's TOC FU Call Status:: Unsuccessful Call (1st Attempt) Unsuccessful Call (1st Attempt) Date: 03/07/24  Attempted to reach the patient regarding the most recent Inpatient/ED visit.  Follow Up Plan: Additional outreach attempts will be made to reach the patient to complete the Transitions of Care (Post Inpatient/ED visit) call.   Signature Talor Desrosiers D, CMA

## 2024-06-28 ENCOUNTER — Encounter (HOSPITAL_COMMUNITY): Payer: Self-pay | Admitting: Emergency Medicine

## 2024-06-28 ENCOUNTER — Emergency Department (HOSPITAL_COMMUNITY): Payer: Self-pay

## 2024-06-28 ENCOUNTER — Emergency Department (HOSPITAL_COMMUNITY)
Admission: EM | Admit: 2024-06-28 | Discharge: 2024-06-28 | Disposition: A | Discharge status: Home / Self Care | Payer: Self-pay | Attending: Emergency Medicine | Admitting: Emergency Medicine

## 2024-06-28 ENCOUNTER — Other Ambulatory Visit: Payer: Self-pay

## 2024-06-28 DIAGNOSIS — S62522A Displaced fracture of distal phalanx of left thumb, initial encounter for closed fracture: Secondary | ICD-10-CM | POA: Insufficient documentation

## 2024-06-28 MED ORDER — KETOROLAC TROMETHAMINE 60 MG/2ML IM SOLN
15.0000 mg | Freq: Once | INTRAMUSCULAR | Status: AC
  Administered 2024-06-28: 15 mg via INTRAMUSCULAR
  Filled 2024-06-28: qty 2

## 2024-06-28 MED ORDER — OXYCODONE-ACETAMINOPHEN 5-325 MG PO TABS: 1.0000 | ORAL_TABLET | Freq: Four times a day (QID) | ORAL | 0 refills | Status: AC | PRN

## 2024-06-28 MED ORDER — NAPROXEN 500 MG PO TABS: 500.0000 mg | ORAL_TABLET | Freq: Two times a day (BID) | ORAL | 0 refills | Status: AC

## 2024-06-28 NOTE — Discharge Instructions (Addendum)
 You are seen today for displaced thumb fracture to your left thumb.  I sent you home with pain medications and splint.  Please be sure to follow-up with hand surgery for further management as your thumb is displaced.  Ice, elevate to help reduce swelling.  I am sending on some anti-inflammatories for you to use as needed.  And some narcotic medications.  Please only save narcotic medications for instances of extreme pain.  As they are at risk for causing constipation, addiction, overuse/overdose.  Please take Naprosyn, 500mg  by mouth twice daily as needed for pain - this in an antiinflammatory medicine (NSAID) and is similar to ibuprofen  - many people feel that it is stronger than ibuprofen  and it is easier to take since it is a smaller pill.  Please use this only for 1 week - if your pain persists, you will need to follow up with your doctor in the office for ongoing guidance and pain control.

## 2024-06-28 NOTE — Progress Notes (Signed)
 Orthopedic Tech Progress Note Patient Details:  Kengo Sturges 1985/11/13 [id number]  Ortho Devices Type of Ortho Device: Finger splint Ortho Device/Splint Location: left thumb Ortho Device/Splint Interventions: Ordered, Application, Adjustment   Post Interventions Patient Tolerated: Well Instructions Provided: Adjustment of device, Care of device  Waylan Thom Loving 06/28/2024, 8:40 PM

## 2024-06-28 NOTE — ED Notes (Signed)
 Patient d/c with home care instructions.

## 2024-06-28 NOTE — ED Triage Notes (Signed)
 While at work today, the patient believes he broke his left thumb. Thumb is swollen and patient is unable to perform active ROM of the DIP joint.

## 2024-06-28 NOTE — ED Provider Notes (Signed)
 [place] EMERGENCY DEPARTMENT AT [facility] Provider Note   CSN: [id number] Arrival date & time: 06/28/24  1606     Patient presents with: Hand Injury   Gabriel Thomas is a 38 y.o. male.    Hand Injury Pt is a 38 y/o male complaining of L thumb pain after pulling weeds and then noticed that while driving a golf cart and bumped into a tractor where steering wheel hit thumb. Noticed increased swelling, decreased ROM. Denies numbness, tingling.  Otherwise, did not hit head, has no other upper or lower extremity injuries.  Had Hx of ORIF to L thumb in 1998.      Prior to Admission medications   Medication Sig Start Date End Date Taking? Authorizing Provider  naproxen (NAPROSYN) 500 MG tablet Take 1 tablet (500 mg total) by mouth 2 (two) times daily. 06/28/24  Yes Elfreida Heggs S, PA-C  oxyCODONE -acetaminophen  (PERCOCET/ROXICET) 5-325 MG tablet Take 1 tablet by mouth every 6 (six) hours as needed for severe pain (pain score 7-10). 06/28/24  Yes Mahika Vanvoorhis S, PA-C  ciprofloxacin  (CILOXAN ) 0.3 % ophthalmic ointment Place 2 drops in left eye 4 times a day for the next 5 days 03/04/24   Waddell Sluder, PA-C  finasteride  (PROSCAR ) 5 MG tablet Take 1 tablet (5 mg total) by mouth daily. 02/06/23   Stoneking, Adine PARAS., MD  tadalafil  (CIALIS ) 5 MG tablet Take 1 tablet (5 mg total) by mouth daily. For prevention priapism 02/06/23   Roseann Adine PARAS., MD  azithromycin  (ZITHROMAX  Z-PAK) 250 MG tablet As dirrected 02/23/12 09/05/19  Plotnikov, Aleksei V, MD    Allergies: Aspirin, Primaquine, and Sulfa antibiotics    Review of Systems  Musculoskeletal:  Positive for arthralgias.  All other systems reviewed and are negative.   Updated Vital Signs BP 127/79   Pulse 67   Temp 98.9 F (37.2 C)   Resp 17   SpO2 100%   Physical Exam Vitals and nursing note reviewed.  Constitutional:      General: He is not in acute distress.    Appearance: Normal appearance. He is not  ill-appearing or diaphoretic.  HENT:     Head: Normocephalic and atraumatic.  Eyes:     General: No scleral icterus.       Right eye: No discharge.        Left eye: No discharge.     Extraocular Movements: Extraocular movements intact.     Conjunctiva/sclera: Conjunctivae normal.  Cardiovascular:     Rate and Rhythm: Normal rate and regular rhythm.     Pulses: Normal pulses.     Heart sounds: Normal heart sounds. No murmur heard.    No friction rub. No gallop.  Pulmonary:     Effort: Pulmonary effort is normal. No respiratory distress.     Breath sounds: No stridor. No wheezing, rhonchi or rales.  Chest:     Chest wall: No tenderness.  Abdominal:     General: Abdomen is flat. There is no distension.     Palpations: Abdomen is soft.     Tenderness: There is no abdominal tenderness. There is no right CVA tenderness, left CVA tenderness, guarding or rebound.  Musculoskeletal:        General: Swelling and tenderness present. No deformity or signs of injury.     Cervical back: Normal range of motion. No rigidity.     Right lower leg: No edema.     Left lower leg: No edema.     Comments:  Noted to have good cap refill in the thumb and all fingers, with no other injuries noted to upper or lower extremities.  Skin:    General: Skin is warm and dry.     Findings: No bruising, erythema or lesion.  Neurological:     General: No focal deficit present.     Mental Status: He is alert and oriented to person, place, and time. Mental status is at baseline.     Sensory: No sensory deficit.     Motor: No weakness.  Psychiatric:        Mood and Affect: Mood normal.     (all labs ordered are listed, but only abnormal results are displayed) Labs Reviewed - No data to display  EKG: None  Radiology: DG Finger Thumb Left Result Date: 06/28/2024 CLINICAL DATA:  Left thumb pain. EXAM: LEFT THUMB 2+V COMPARISON:  None Available. FINDINGS: Mildly displaced oblique fracture involving the thumb  distal phalanx at the tuft. Fracture is mildly comminuted. No other fracture. IMPRESSION: Mildly displaced fracture involving the thumb distal phalanx. Electronically Signed   By: Juliene Balder M.D.   On: 06/28/2024 17:36   Procedures   Medications Ordered in the ED  ketorolac  (TORADOL ) injection 15 mg (15 mg Intramuscular Given 06/28/24 1945)    Medical Decision Making Amount and/or Complexity of Data Reviewed Radiology: ordered.  Risk Prescription drug management.   This patient is a 38 year old male who presents to the ED for concern of left thumb pain after mechanical injury, noting to have had displaced distal phalanx fracture to thumb, closed.  Notably has full sensation and range of motion at MCP joint but is not able to move it DIP joint secondary to swelling.  Good cap refill present.  Patient was placed in a finger splint and provided pain medications.  Will have him continue follow-up with hand surgery.  With him having history of ORIF to left thumb.  Patient vital signs have remained stable throughout the course of patient's time in the ED. Low suspicion for any other emergent pathology at this time. I believe this patient is safe to be discharged. Provided strict return to ER precautions. Patient expressed agreement and understanding of plan. All questions were answered.  Differential diagnoses prior to evaluation: The emergent differential diagnosis includes, but is not limited to, fracture, ligamentous injury, neurovascular injury, dislocation, malalignment. This is not an exhaustive differential.   Past Medical History / Co-morbidities / Social History: Sickle cell trait, status post ORIF of finger   Additional history: Chart reviewed.   Lab Tests/Imaging studies: I personally interpreted labs/imaging and the pertinent results include:    X-ray shows mildly displaced fracture along distal phalanx of the thumb   I agree with the radiologist interpretation.      Medications: I ordered medication including Toradol , naproxen, Percocet.  I have reviewed the patients home medicines and have made adjustments as needed.  Critical Interventions: None  Social Determinants of Health: None  Disposition: After consideration of the diagnostic results and the patients response to treatment, I feel that the patient would benefit from discharge and treatment as above.   emergency department workup does not suggest an emergent condition requiring admission or immediate intervention beyond what has been performed at this time. The plan is: Follow-up with hand surgery, splint, management at home. The patient is safe for discharge and has been instructed to return immediately for worsening symptoms, change in symptoms or any other concerns.   Final diagnoses:  Closed displaced  fracture of distal phalanx of left thumb, initial encounter    ED Discharge Orders          Ordered    naproxen (NAPROSYN) 500 MG tablet  2 times daily        06/28/24 2103    oxyCODONE -acetaminophen  (PERCOCET/ROXICET) 5-325 MG tablet  Every 6 hours PRN        06/28/24 2103               Alysiah Suppa S, PA-C 06/28/24 2210    Patt Alm Macho, MD 06/29/24 0003
